# Patient Record
Sex: Female | Born: 1950
Health system: Southern US, Community
[De-identification: ages and names within clinical notes are randomized; demographics above are authoritative.]

## PROBLEM LIST (undated history)

## (undated) DIAGNOSIS — Z87442 Personal history of urinary calculi: Secondary | ICD-10-CM

## (undated) DIAGNOSIS — F419 Anxiety disorder, unspecified: Secondary | ICD-10-CM

## (undated) DIAGNOSIS — T7840XA Allergy, unspecified, initial encounter: Secondary | ICD-10-CM

## (undated) DIAGNOSIS — Z6827 Body mass index (BMI) 27.0-27.9, adult: Secondary | ICD-10-CM

## (undated) DIAGNOSIS — E559 Vitamin D deficiency, unspecified: Secondary | ICD-10-CM

## (undated) DIAGNOSIS — E785 Hyperlipidemia, unspecified: Secondary | ICD-10-CM

## (undated) DIAGNOSIS — J302 Other seasonal allergic rhinitis: Secondary | ICD-10-CM

## (undated) DIAGNOSIS — K219 Gastro-esophageal reflux disease without esophagitis: Secondary | ICD-10-CM

## (undated) DIAGNOSIS — F410 Panic disorder [episodic paroxysmal anxiety] without agoraphobia: Secondary | ICD-10-CM

## (undated) HISTORY — DX: Panic disorder (episodic paroxysmal anxiety): F41.0

## (undated) HISTORY — DX: Gastro-esophageal reflux disease without esophagitis: K21.9

## (undated) HISTORY — DX: Body mass index (BMI) 27.0-27.9, adult: Z68.27

## (undated) HISTORY — DX: Personal history of urinary calculi: Z87.442

## (undated) HISTORY — DX: Hyperlipidemia, unspecified: E78.5

## (undated) HISTORY — DX: Other seasonal allergic rhinitis: J30.2

## (undated) HISTORY — DX: Allergy, unspecified, initial encounter: T78.40XA

## (undated) HISTORY — DX: Vitamin D deficiency, unspecified: E55.9

## (undated) HISTORY — DX: Anxiety disorder, unspecified: F41.9

## (undated) HISTORY — PX: ABDOMINAL HYSTERECTOMY: SHX81

---

## 1998-07-08 ENCOUNTER — Other Ambulatory Visit: Admission: RE | Admit: 1998-07-08 | Discharge: 1998-07-08 | Payer: Self-pay | Admitting: Gynecology

## 1999-08-04 ENCOUNTER — Encounter: Payer: Self-pay | Admitting: Gynecology

## 1999-08-04 ENCOUNTER — Encounter: Admission: RE | Admit: 1999-08-04 | Discharge: 1999-08-04 | Payer: Self-pay | Admitting: Gynecology

## 1999-08-31 ENCOUNTER — Other Ambulatory Visit: Admission: RE | Admit: 1999-08-31 | Discharge: 1999-08-31 | Payer: Self-pay | Admitting: Gynecology

## 2000-08-09 ENCOUNTER — Encounter: Admission: RE | Admit: 2000-08-09 | Discharge: 2000-08-09 | Payer: Self-pay | Admitting: Gynecology

## 2000-08-09 ENCOUNTER — Encounter: Payer: Self-pay | Admitting: Gynecology

## 2000-11-07 ENCOUNTER — Other Ambulatory Visit: Admission: RE | Admit: 2000-11-07 | Discharge: 2000-11-07 | Payer: Self-pay | Admitting: Gynecology

## 2001-10-06 ENCOUNTER — Encounter: Admission: RE | Admit: 2001-10-06 | Discharge: 2001-10-06 | Payer: Self-pay | Admitting: Gynecology

## 2001-10-06 ENCOUNTER — Encounter: Payer: Self-pay | Admitting: Gynecology

## 2002-02-01 ENCOUNTER — Other Ambulatory Visit: Admission: RE | Admit: 2002-02-01 | Discharge: 2002-02-01 | Payer: Self-pay | Admitting: Gynecology

## 2003-01-31 ENCOUNTER — Encounter: Payer: Self-pay | Admitting: Gynecology

## 2003-01-31 ENCOUNTER — Encounter: Admission: RE | Admit: 2003-01-31 | Discharge: 2003-01-31 | Payer: Self-pay | Admitting: Gynecology

## 2003-02-26 ENCOUNTER — Other Ambulatory Visit: Admission: RE | Admit: 2003-02-26 | Discharge: 2003-02-26 | Payer: Self-pay | Admitting: Gynecology

## 2004-04-10 ENCOUNTER — Encounter: Admission: RE | Admit: 2004-04-10 | Discharge: 2004-04-10 | Payer: Self-pay | Admitting: Gynecology

## 2004-05-05 ENCOUNTER — Other Ambulatory Visit: Admission: RE | Admit: 2004-05-05 | Discharge: 2004-05-05 | Payer: Self-pay | Admitting: Gynecology

## 2004-05-31 ENCOUNTER — Emergency Department (HOSPITAL_COMMUNITY): Admission: EM | Admit: 2004-05-31 | Discharge: 2004-05-31 | Payer: Self-pay | Admitting: Emergency Medicine

## 2005-08-16 ENCOUNTER — Encounter: Admission: RE | Admit: 2005-08-16 | Discharge: 2005-08-16 | Payer: Self-pay | Admitting: Gynecology

## 2005-09-06 ENCOUNTER — Encounter: Admission: RE | Admit: 2005-09-06 | Discharge: 2005-09-06 | Payer: Self-pay | Admitting: Gynecology

## 2005-10-04 ENCOUNTER — Other Ambulatory Visit: Admission: RE | Admit: 2005-10-04 | Discharge: 2005-10-04 | Payer: Self-pay | Admitting: Gynecology

## 2006-09-28 ENCOUNTER — Encounter: Admission: RE | Admit: 2006-09-28 | Discharge: 2006-09-28 | Payer: Self-pay | Admitting: Gynecology

## 2006-10-25 ENCOUNTER — Other Ambulatory Visit: Admission: RE | Admit: 2006-10-25 | Discharge: 2006-10-25 | Payer: Self-pay | Admitting: Gynecology

## 2007-10-12 ENCOUNTER — Encounter: Admission: RE | Admit: 2007-10-12 | Discharge: 2007-10-12 | Payer: Self-pay | Admitting: Gynecology

## 2009-12-01 ENCOUNTER — Encounter: Admission: RE | Admit: 2009-12-01 | Discharge: 2009-12-01 | Payer: Self-pay | Admitting: Gynecology

## 2011-06-09 ENCOUNTER — Other Ambulatory Visit: Payer: Self-pay | Admitting: Gynecology

## 2011-06-09 DIAGNOSIS — Z1231 Encounter for screening mammogram for malignant neoplasm of breast: Secondary | ICD-10-CM

## 2011-06-10 ENCOUNTER — Ambulatory Visit
Admission: RE | Admit: 2011-06-10 | Discharge: 2011-06-10 | Disposition: A | Discharge status: Home / Self Care | Payer: BC Managed Care – PPO | Source: Ambulatory Visit | Attending: Gynecology | Admitting: Gynecology

## 2011-06-10 DIAGNOSIS — Z1231 Encounter for screening mammogram for malignant neoplasm of breast: Secondary | ICD-10-CM

## 2012-05-24 ENCOUNTER — Telehealth: Payer: Self-pay | Admitting: Gastroenterology

## 2012-05-24 NOTE — Telephone Encounter (Signed)
Pt states she has been having problems with acid reflux for a while. States she had a "spell" a couple of nights ago. Reports she had a lot of pain right at her breast line between her shoulder and around to her back. She was seen by her primary in [place] Dr. Foye Deer and sent to [facility] for an Ultrasound of the gallbladder. Pt states it was negative for stones but they wanted her to have a HIDA scan done next. Pt states she does not want to be evaluated at [facility]. Would like to be seen by [facility], requesting an appt.sooner than 1st available. Pt scheduled to see Mike Gip PA tomorrow at 9am. Pt to bring hospital records and notes from her PCP to visit. Pt instructed to bring medications, insurance card and copay with her. Pt aware of appt date and time.

## 2012-05-25 ENCOUNTER — Ambulatory Visit (INDEPENDENT_AMBULATORY_CARE_PROVIDER_SITE_OTHER): Payer: BC Managed Care – PPO | Admitting: Physician Assistant

## 2012-05-25 ENCOUNTER — Encounter: Payer: Self-pay | Admitting: Physician Assistant

## 2012-05-25 VITALS — BP 128/64 | HR 74 | Ht 62.0 in | Wt 165.0 lb

## 2012-05-25 DIAGNOSIS — R1013 Epigastric pain: Secondary | ICD-10-CM

## 2012-05-25 DIAGNOSIS — K219 Gastro-esophageal reflux disease without esophagitis: Secondary | ICD-10-CM

## 2012-05-25 MED ORDER — PANTOPRAZOLE SODIUM 40 MG PO TBEC: 40.0000 mg | DELAYED_RELEASE_TABLET | Freq: Every day | ORAL | Status: DC

## 2012-05-25 NOTE — Progress Notes (Signed)
Subjective:    Patient ID: Jasmin Owens, female    DOB: [birth year], 61 y.o.   MRN: [id number]  HPI Jasmin Owens is a pleasant 61 year old female known to Dr. Arlyce Dice remotely from upper endoscopy done in 2004. This was a normal exam. She also relates that she had colonoscopy by Dr. Kinnie Scales about 5 years ago and we will obtain copy of that report.  She is in generally good health. She comes in today with complaints of an episode of fairly intense epigastric pain with radiation into her shoulder blades which occurred in the middle tonight. This episode occurred week ago he has not had another episode sense. She says the pain lasted for about 10-15 minutes was associated with nausea but no vomiting and then gradually eased off. She had it up out of bed walker around considering her recliner etc. She was seen by her primary care physician and had labs done on 05/24/2012 these are unremarkable including a normal CBC and seen at with the exception of an alkaline phosphatase mildly elevated at 121. She also had a limited upper abdominal ultrasound done at [facility] which reads no evidence of gallstones or gallbladder wall thickening common bile duct normal size there is no mention of the pancreas. Patient says she has been having problems with reflux symptoms which are present gradually progressed over the past several months. She says she will typically gets burning and indigestion after meals had been taking over-the-counter Prilosec and ranitidine as needed and got to the point where those were not working very well. She had been started on Protonix about 4 weeks ago by her primary care physician and has been taking a daily and says that she does feel better from that standpoint. She denies any dysphagia or odynophagia. Her appetite has been fine and her weight has been stable. She says the episode that she had in the middle of the night no different than her usual indigestion type symptoms    Review  of Systems  Constitutional: Negative.   HENT: Negative.   Eyes: Negative.   Respiratory: Negative.   Cardiovascular: Negative.   Gastrointestinal: Positive for nausea and abdominal pain.  Genitourinary: Negative.   Musculoskeletal: Negative.   Neurological: Negative.   Hematological: Negative.   Psychiatric/Behavioral: Negative.    Outpatient Encounter Prescriptions as of 05/25/2012  Medication Sig Dispense Refill  . aspirin 81 MG tablet Take 81 mg by mouth daily.      . pantoprazole (PROTONIX) 40 MG tablet Take 1 tablet (40 mg total) by mouth daily. Takes one tablet by mouth once daily  30 tablet  11  . DISCONTD: pantoprazole (PROTONIX) 40 MG tablet Takes one tablet by mouth once daily          No Known Allergies Patient Active Problem List  Diagnosis  . GERD (gastroesophageal reflux disease)   History   Social History  . Marital Status: Married    Spouse Name: N/A    Number of Children: N/A  . Years of Education: N/A   Occupational History  . Not on file.   Social History Main Topics  . Smoking status: Never Smoker   . Smokeless tobacco: Never Used  . Alcohol Use: No  . Drug Use: No  . Sexually Active: Not on file   Other Topics Concern  . Not on file   Social History Narrative  . No narrative on file    Objective:   Physical Exam well-developed white female in no acute distress, pleasant  blood pressure 128/64 pulse 74 height 5 foot 2 weight 165. HEENT; nontraumatic normocephalic EOMI PERRLA sclera anicteric,Neck; Supple no JVD, Cardiovascular; regular rate and rhythm with S1-S2 no murmur or gallop, Pulmonary; clear bilaterally, Abdomen; soft basically nontender there's no palpable mass or hepatosplenomegaly bowel sounds are active, Rectal; not done, Extremities; no clubbing cyanosis or edema skin warm and dry, Psych; mood and affect normal and appropriate.        Assessment & Plan:  #69 61 year old female with chronic GERD-currently doing well on daily  protonix #2 single episode of intense epigastric pain radiating to back to her back and into the shoulder blades, occurring 1 week ago. Patient's description of pain is a fairly typical for biliary colic could have also been secondary to acid reflux. Will rule out biliary dyskinesia #3 colon neoplasia surveillance-pt had colonoscopy approximately 5 years ago per Dr. Kinnie Scales we will obtain copy of the report, then decide on timing of followup.  Plan; continue Protonix 40 mg by mouth every morning-prescriptions sent today Low-fat diet Schedule for [facility] HIDA scan-further plans pending results of HIDA scan Patient was also asked to call should she have any recurrent episodes and at that time would obtain a CBC and hepatic panel.

## 2012-05-25 NOTE — Patient Instructions (Addendum)
We have scheduled the [place] for Mon 05-29-2012.  Arrive to [facility], 1st floor at 9:45 Am.   Do not take the Protonix that day.  Have nothing by mouth after midnight.  We sent a prescription refill for Protonix to [facility].

## 2012-05-25 NOTE — Progress Notes (Signed)
I agree with assessment and plan.

## 2012-05-29 ENCOUNTER — Other Ambulatory Visit (HOSPITAL_COMMUNITY): Payer: BC Managed Care – PPO

## 2012-05-29 ENCOUNTER — Encounter (HOSPITAL_COMMUNITY)
Admission: RE | Admit: 2012-05-29 | Discharge: 2012-05-29 | Disposition: A | Discharge status: Home / Self Care | Payer: BC Managed Care – PPO | Source: Ambulatory Visit | Attending: Physician Assistant | Admitting: Physician Assistant

## 2012-05-29 DIAGNOSIS — R112 Nausea with vomiting, unspecified: Secondary | ICD-10-CM | POA: Insufficient documentation

## 2012-05-29 DIAGNOSIS — R109 Unspecified abdominal pain: Secondary | ICD-10-CM | POA: Insufficient documentation

## 2012-05-29 DIAGNOSIS — R1013 Epigastric pain: Secondary | ICD-10-CM

## 2012-05-29 MED ORDER — TECHNETIUM TC 99M MEBROFENIN IV KIT
5.5000 | PACK | Freq: Once | INTRAVENOUS | Status: AC | PRN
  Administered 2012-05-29: 6 via INTRAVENOUS

## 2012-06-20 ENCOUNTER — Telehealth: Payer: Self-pay | Admitting: Physician Assistant

## 2012-06-20 NOTE — Telephone Encounter (Signed)
Forward 11 pages from [facility] to Dr. Mike Gip for review on 06-20-12 ym

## 2012-07-04 ENCOUNTER — Other Ambulatory Visit: Payer: Self-pay | Admitting: Gynecology

## 2012-07-04 DIAGNOSIS — Z1231 Encounter for screening mammogram for malignant neoplasm of breast: Secondary | ICD-10-CM

## 2012-08-03 ENCOUNTER — Ambulatory Visit
Admission: RE | Admit: 2012-08-03 | Discharge: 2012-08-03 | Disposition: A | Discharge status: Home / Self Care | Payer: BC Managed Care – PPO | Source: Ambulatory Visit | Attending: Gynecology | Admitting: Gynecology

## 2012-08-03 DIAGNOSIS — Z1231 Encounter for screening mammogram for malignant neoplasm of breast: Secondary | ICD-10-CM

## 2013-05-03 IMAGING — NM NM HEPATO W/GB/PHARM/[PERSON_NAME]
1 series · 12 of 12 positions shown · non-contrast
Comparison: None.

CLINICAL DATA: Right upper quadrant and epigastric pain radiating
to back. Nausea and vomiting.

NUCLEAR MEDICINE HEPATOBILIARY IMAGING WITH GALLBLADDER EF
TECHNIQUE: Sequential images of the abdomen were obtained [DATE] minutes following intravenous administration of
radiopharmaceutical. After oral ingestion of Ensure, gallbladder
ejection fraction was determined.
Radiopharmaceutical:  5.5 mCi Ac-PPm Choletec

[Series 1: hepato · 4.46mm/px · 2 acquisitions, 12 frames shown]
[im 1/2]
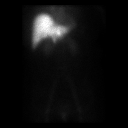
[im 1/2]
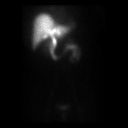
[im 1/2]
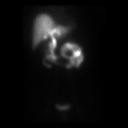
[im 1/2]
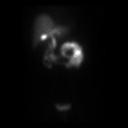
[im 1/2]
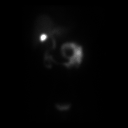
[im 1/2]
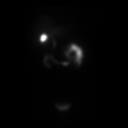
[im 2/2]
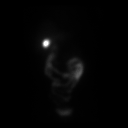
[im 2/2]
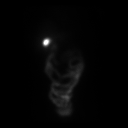
[im 2/2]
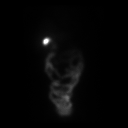
[im 2/2]
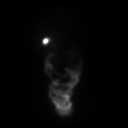
[im 2/2]
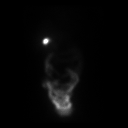
[im 2/2]
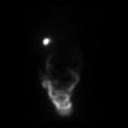

[12 of 12 positions shown; findings below may reference images not displayed]

FINDINGS: Prompt radiopharmaceutical uptake by the liver is seen.
Liver is normal in appearance.

Prompt biliary excretion activity is demonstrated, which reaches
the small bowel initially on the 15-minute image.  Gallbladder
activity is seen initially on the 10-minute image and shows
progressive filling subsequently.

Gallbladder ejection fraction:  55%. Normal gallbladder ejection
fraction with Ensure is greater than 33%.

The patient did not experience symptoms after oral ingestion of
Ensure.
IMPRESSION: 1.  Normal hepatobiliary scan.
2.  Normal gallbladder ejection fraction of 55%.

## 2013-12-06 ENCOUNTER — Other Ambulatory Visit: Payer: Self-pay

## 2013-12-06 DIAGNOSIS — Z1231 Encounter for screening mammogram for malignant neoplasm of breast: Secondary | ICD-10-CM

## 2013-12-24 ENCOUNTER — Ambulatory Visit: Payer: BC Managed Care – PPO

## 2015-04-11 ENCOUNTER — Telehealth: Payer: Self-pay | Admitting: Gastroenterology

## 2015-04-11 NOTE — Telephone Encounter (Signed)
Received records from [place] forwarded to Dr. Deatra Ina 04/11/15 fbg.

## 2015-05-12 ENCOUNTER — Ambulatory Visit: Payer: Self-pay | Admitting: Gastroenterology

## 2016-10-23 DIAGNOSIS — R339 Retention of urine, unspecified: Secondary | ICD-10-CM | POA: Diagnosis not present

## 2016-11-25 DIAGNOSIS — Z6828 Body mass index (BMI) 28.0-28.9, adult: Secondary | ICD-10-CM | POA: Diagnosis not present

## 2016-11-25 DIAGNOSIS — E663 Overweight: Secondary | ICD-10-CM | POA: Diagnosis not present

## 2016-11-25 DIAGNOSIS — R109 Unspecified abdominal pain: Secondary | ICD-10-CM | POA: Diagnosis not present

## 2016-11-25 DIAGNOSIS — Z9181 History of falling: Secondary | ICD-10-CM | POA: Diagnosis not present

## 2016-11-25 DIAGNOSIS — M545 Low back pain: Secondary | ICD-10-CM | POA: Diagnosis not present

## 2016-12-27 DIAGNOSIS — H53022 Refractive amblyopia, left eye: Secondary | ICD-10-CM | POA: Diagnosis not present

## 2016-12-27 DIAGNOSIS — H52221 Regular astigmatism, right eye: Secondary | ICD-10-CM | POA: Diagnosis not present

## 2016-12-27 DIAGNOSIS — H2513 Age-related nuclear cataract, bilateral: Secondary | ICD-10-CM | POA: Diagnosis not present

## 2017-02-18 DIAGNOSIS — J029 Acute pharyngitis, unspecified: Secondary | ICD-10-CM | POA: Diagnosis not present

## 2017-02-18 DIAGNOSIS — J208 Acute bronchitis due to other specified organisms: Secondary | ICD-10-CM | POA: Diagnosis not present

## 2017-02-18 DIAGNOSIS — Z6828 Body mass index (BMI) 28.0-28.9, adult: Secondary | ICD-10-CM | POA: Diagnosis not present

## 2017-03-22 DIAGNOSIS — Z6828 Body mass index (BMI) 28.0-28.9, adult: Secondary | ICD-10-CM | POA: Diagnosis not present

## 2017-03-22 DIAGNOSIS — F410 Panic disorder [episodic paroxysmal anxiety] without agoraphobia: Secondary | ICD-10-CM | POA: Diagnosis not present

## 2017-03-22 DIAGNOSIS — F419 Anxiety disorder, unspecified: Secondary | ICD-10-CM | POA: Diagnosis not present

## 2017-04-22 DIAGNOSIS — Z1389 Encounter for screening for other disorder: Secondary | ICD-10-CM | POA: Diagnosis not present

## 2017-04-22 DIAGNOSIS — Z6829 Body mass index (BMI) 29.0-29.9, adult: Secondary | ICD-10-CM | POA: Diagnosis not present

## 2017-04-22 DIAGNOSIS — F410 Panic disorder [episodic paroxysmal anxiety] without agoraphobia: Secondary | ICD-10-CM | POA: Diagnosis not present

## 2017-04-22 DIAGNOSIS — Z23 Encounter for immunization: Secondary | ICD-10-CM | POA: Diagnosis not present

## 2017-04-22 DIAGNOSIS — F419 Anxiety disorder, unspecified: Secondary | ICD-10-CM | POA: Diagnosis not present

## 2017-07-26 DIAGNOSIS — Z23 Encounter for immunization: Secondary | ICD-10-CM | POA: Diagnosis not present

## 2017-07-26 DIAGNOSIS — Z6829 Body mass index (BMI) 29.0-29.9, adult: Secondary | ICD-10-CM | POA: Diagnosis not present

## 2017-07-26 DIAGNOSIS — F419 Anxiety disorder, unspecified: Secondary | ICD-10-CM | POA: Diagnosis not present

## 2017-07-26 DIAGNOSIS — F410 Panic disorder [episodic paroxysmal anxiety] without agoraphobia: Secondary | ICD-10-CM | POA: Diagnosis not present

## 2017-07-26 DIAGNOSIS — E785 Hyperlipidemia, unspecified: Secondary | ICD-10-CM | POA: Diagnosis not present

## 2017-07-26 DIAGNOSIS — E559 Vitamin D deficiency, unspecified: Secondary | ICD-10-CM | POA: Diagnosis not present

## 2017-08-18 DIAGNOSIS — K5730 Diverticulosis of large intestine without perforation or abscess without bleeding: Secondary | ICD-10-CM | POA: Diagnosis not present

## 2017-08-18 DIAGNOSIS — D125 Benign neoplasm of sigmoid colon: Secondary | ICD-10-CM | POA: Diagnosis not present

## 2017-08-18 DIAGNOSIS — Z1211 Encounter for screening for malignant neoplasm of colon: Secondary | ICD-10-CM | POA: Diagnosis not present

## 2017-08-18 DIAGNOSIS — K635 Polyp of colon: Secondary | ICD-10-CM | POA: Diagnosis not present

## 2017-09-22 DIAGNOSIS — N952 Postmenopausal atrophic vaginitis: Secondary | ICD-10-CM | POA: Diagnosis not present

## 2017-09-22 DIAGNOSIS — Z01419 Encounter for gynecological examination (general) (routine) without abnormal findings: Secondary | ICD-10-CM | POA: Diagnosis not present

## 2017-09-22 DIAGNOSIS — Z1231 Encounter for screening mammogram for malignant neoplasm of breast: Secondary | ICD-10-CM | POA: Diagnosis not present

## 2017-10-11 DIAGNOSIS — H53022 Refractive amblyopia, left eye: Secondary | ICD-10-CM | POA: Diagnosis not present

## 2017-10-11 DIAGNOSIS — H2513 Age-related nuclear cataract, bilateral: Secondary | ICD-10-CM | POA: Diagnosis not present

## 2017-12-07 DIAGNOSIS — J019 Acute sinusitis, unspecified: Secondary | ICD-10-CM | POA: Diagnosis not present

## 2018-01-26 DIAGNOSIS — Z6830 Body mass index (BMI) 30.0-30.9, adult: Secondary | ICD-10-CM | POA: Diagnosis not present

## 2018-01-26 DIAGNOSIS — Z9181 History of falling: Secondary | ICD-10-CM | POA: Diagnosis not present

## 2018-01-26 DIAGNOSIS — F410 Panic disorder [episodic paroxysmal anxiety] without agoraphobia: Secondary | ICD-10-CM | POA: Diagnosis not present

## 2018-01-26 DIAGNOSIS — Z139 Encounter for screening, unspecified: Secondary | ICD-10-CM | POA: Diagnosis not present

## 2018-01-26 DIAGNOSIS — E785 Hyperlipidemia, unspecified: Secondary | ICD-10-CM | POA: Diagnosis not present

## 2018-01-26 DIAGNOSIS — E559 Vitamin D deficiency, unspecified: Secondary | ICD-10-CM | POA: Diagnosis not present

## 2018-01-26 DIAGNOSIS — F419 Anxiety disorder, unspecified: Secondary | ICD-10-CM | POA: Diagnosis not present

## 2018-06-28 DIAGNOSIS — Z6830 Body mass index (BMI) 30.0-30.9, adult: Secondary | ICD-10-CM | POA: Diagnosis not present

## 2018-06-28 DIAGNOSIS — E785 Hyperlipidemia, unspecified: Secondary | ICD-10-CM | POA: Diagnosis not present

## 2018-06-28 DIAGNOSIS — Z0000 Encounter for general adult medical examination without abnormal findings: Secondary | ICD-10-CM | POA: Diagnosis not present

## 2018-06-28 DIAGNOSIS — Z1339 Encounter for screening examination for other mental health and behavioral disorders: Secondary | ICD-10-CM | POA: Diagnosis not present

## 2018-06-28 DIAGNOSIS — Z9181 History of falling: Secondary | ICD-10-CM | POA: Diagnosis not present

## 2018-06-28 DIAGNOSIS — Z1331 Encounter for screening for depression: Secondary | ICD-10-CM | POA: Diagnosis not present

## 2018-06-28 DIAGNOSIS — Z23 Encounter for immunization: Secondary | ICD-10-CM | POA: Diagnosis not present

## 2018-06-28 DIAGNOSIS — Z136 Encounter for screening for cardiovascular disorders: Secondary | ICD-10-CM | POA: Diagnosis not present

## 2018-08-02 DIAGNOSIS — E789 Disorder of lipoprotein metabolism, unspecified: Secondary | ICD-10-CM | POA: Diagnosis not present

## 2018-08-02 DIAGNOSIS — E785 Hyperlipidemia, unspecified: Secondary | ICD-10-CM | POA: Diagnosis not present

## 2018-08-02 DIAGNOSIS — J324 Chronic pansinusitis: Secondary | ICD-10-CM | POA: Diagnosis not present

## 2018-08-02 DIAGNOSIS — F419 Anxiety disorder, unspecified: Secondary | ICD-10-CM | POA: Diagnosis not present

## 2018-08-02 DIAGNOSIS — E559 Vitamin D deficiency, unspecified: Secondary | ICD-10-CM | POA: Diagnosis not present

## 2018-08-02 DIAGNOSIS — Z23 Encounter for immunization: Secondary | ICD-10-CM | POA: Diagnosis not present

## 2018-08-02 DIAGNOSIS — F410 Panic disorder [episodic paroxysmal anxiety] without agoraphobia: Secondary | ICD-10-CM | POA: Diagnosis not present

## 2018-08-10 ENCOUNTER — Emergency Department (HOSPITAL_COMMUNITY)
Admission: EM | Admit: 2018-08-10 | Discharge: 2018-08-10 | Disposition: A | Discharge status: Home / Self Care | Payer: PPO | Attending: Emergency Medicine | Admitting: Emergency Medicine

## 2018-08-10 ENCOUNTER — Other Ambulatory Visit: Payer: Self-pay

## 2018-08-10 ENCOUNTER — Encounter (HOSPITAL_COMMUNITY): Payer: Self-pay

## 2018-08-10 DIAGNOSIS — R739 Hyperglycemia, unspecified: Secondary | ICD-10-CM | POA: Diagnosis not present

## 2018-08-10 DIAGNOSIS — R112 Nausea with vomiting, unspecified: Secondary | ICD-10-CM | POA: Diagnosis not present

## 2018-08-10 DIAGNOSIS — M6281 Muscle weakness (generalized): Secondary | ICD-10-CM | POA: Diagnosis not present

## 2018-08-10 DIAGNOSIS — Z7982 Long term (current) use of aspirin: Secondary | ICD-10-CM | POA: Diagnosis not present

## 2018-08-10 DIAGNOSIS — R531 Weakness: Secondary | ICD-10-CM | POA: Diagnosis not present

## 2018-08-10 DIAGNOSIS — R61 Generalized hyperhidrosis: Secondary | ICD-10-CM | POA: Diagnosis not present

## 2018-08-10 DIAGNOSIS — R0602 Shortness of breath: Secondary | ICD-10-CM | POA: Diagnosis not present

## 2018-08-10 DIAGNOSIS — I10 Essential (primary) hypertension: Secondary | ICD-10-CM | POA: Diagnosis not present

## 2018-08-10 DIAGNOSIS — R1110 Vomiting, unspecified: Secondary | ICD-10-CM | POA: Diagnosis present

## 2018-08-10 LAB — CBC WITH DIFFERENTIAL/PLATELET
ABS IMMATURE GRANULOCYTES: 0.1 10*3/uL — AB (ref 0.00–0.07)
BASOS PCT: 0 %
Basophils Absolute: 0 10*3/uL (ref 0.0–0.1)
Eosinophils Absolute: 0.1 10*3/uL (ref 0.0–0.5)
Eosinophils Relative: 1 %
HCT: 43.8 % (ref 36.0–46.0)
HEMOGLOBIN: 13.7 g/dL (ref 12.0–15.0)
IMMATURE GRANULOCYTES: 1 %
Lymphocytes Relative: 17 %
Lymphs Abs: 1.6 10*3/uL (ref 0.7–4.0)
MCH: 28 pg (ref 26.0–34.0)
MCHC: 31.3 g/dL (ref 30.0–36.0)
MCV: 89.6 fL (ref 80.0–100.0)
MONOS PCT: 4 %
Monocytes Absolute: 0.4 10*3/uL (ref 0.1–1.0)
NEUTROS PCT: 77 %
NRBC: 0 % (ref 0.0–0.2)
Neutro Abs: 7.3 10*3/uL (ref 1.7–7.7)
PLATELETS: 323 10*3/uL (ref 150–400)
RBC: 4.89 MIL/uL (ref 3.87–5.11)
RDW: 12.4 % (ref 11.5–15.5)
WBC: 9.5 10*3/uL (ref 4.0–10.5)

## 2018-08-10 LAB — COMPREHENSIVE METABOLIC PANEL
ALT: 17 U/L (ref 0–44)
AST: 19 U/L (ref 15–41)
Albumin: 3.9 g/dL (ref 3.5–5.0)
Alkaline Phosphatase: 90 U/L (ref 38–126)
Anion gap: 8 (ref 5–15)
BUN: 9 mg/dL (ref 8–23)
CALCIUM: 8.8 mg/dL — AB (ref 8.9–10.3)
CO2: 25 mmol/L (ref 22–32)
Chloride: 107 mmol/L (ref 98–111)
Creatinine, Ser: 0.75 mg/dL (ref 0.44–1.00)
GFR calc Af Amer: >60 mL/min (ref >60)
Glucose, Bld: 145 mg/dL — ABNORMAL HIGH (ref 70–99)
Potassium: 3.5 mmol/L (ref 3.5–5.1)
SODIUM: 140 mmol/L (ref 135–145)
Total Bilirubin: 0.7 mg/dL (ref 0.3–1.2)
Total Protein: 7.3 g/dL (ref 6.5–8.1)

## 2018-08-10 LAB — I-STAT TROPONIN, ED: Troponin i, poc: 0 ng/mL (ref 0.00–0.08)

## 2018-08-10 LAB — LIPASE, BLOOD: LIPASE: 27 U/L (ref 11–51)

## 2018-08-10 MED ORDER — METOCLOPRAMIDE HCL 5 MG/ML IJ SOLN
5.0000 mg | Freq: Once | INTRAMUSCULAR | Status: AC
  Administered 2018-08-10: 5 mg via INTRAVENOUS
  Filled 2018-08-10: qty 2

## 2018-08-10 MED ORDER — METOCLOPRAMIDE HCL 10 MG PO TABS: 5.0000 mg | ORAL_TABLET | Freq: Four times a day (QID) | ORAL | 0 refills | Status: DC | PRN

## 2018-08-10 NOTE — ED Triage Notes (Signed)
Pt presents bib Jasmin Owens with sudden onset emesis, weakness, palor, and diaphoresis. BP on arrival 170/110.  Transporters no access to EKG monitor. Denies pain. States she was normal this morning sitting at desk at work and started feeling dizzy.  Took her antivert medication for inner ear issue and began vomiting shortly after.

## 2018-08-10 NOTE — ED Provider Notes (Signed)
[facility] EMERGENCY DEPARTMENT Provider Note   CSN: [id number] Arrival date & time: 08/10/18  1122     History   Chief Complaint Chief Complaint  Patient presents with  . Emesis    HPI Jasmin Owens is a 67 y.o. female.  HPI Complains of generalized weakness, nausea and vomiting onset 8:30 AM today while at work.  She also feels "clammy" meaning intermittently hot and cold.  She denies any shortness of breath denies chest pain or pain anywhere.  Associated symptoms: she vomited twice, no hematemesis.  She feels improved without treatment since arrival here.  Still complains of mild nausea.  No other associated symptoms. Past Medical History:  Diagnosis Date  . GERD (gastroesophageal reflux disease)   . History of kidney stones   Vertigo, hypercholesterolemia  Patient Active Problem List   Diagnosis Date Noted  . GERD (gastroesophageal reflux disease) 05/25/2012    Past Surgical History:  Procedure Laterality Date  . ABDOMINAL HYSTERECTOMY       OB History   None      Home Medications    Prior to Admission medications   Medication Sig Start Date End Date Taking? Authorizing Provider  aspirin 81 MG tablet Take 81 mg by mouth daily.    [provider]  pantoprazole (PROTONIX) 40 MG tablet Take 1 tablet (40 mg total) by mouth daily. Takes one tablet by mouth once daily 05/25/12   Esterwood, Amy S, PA-C    Family History Family History  Problem Relation Age of Onset  . Colon cancer Neg Hx     Social History Social History   Tobacco Use  . Smoking status: Never Smoker  . Smokeless tobacco: Never Used  Substance Use Topics  . Alcohol use: No  . Drug use: No     Allergies   Patient has no known allergies.   Review of Systems Review of Systems  Constitutional: Positive for diaphoresis.  HENT: Negative.   Respiratory: Negative.   Cardiovascular: Negative.   Gastrointestinal: Positive for nausea and vomiting.    Musculoskeletal: Negative.   Skin: Negative.   Neurological: Negative.   Psychiatric/Behavioral: Negative.   All other systems reviewed and are negative.    Physical Exam Updated Vital Signs BP (!) 146/88 (BP Location: Right Arm)   Pulse 93   Temp (!) 97.5 F (36.4 C) (Oral)   Resp 14   Ht 5\' 2"  (1.575 m)   Wt 74.8 kg   SpO2 100%   BMI 30.18 kg/m   Physical Exam  Constitutional: She appears well-developed and well-nourished.  HENT:  Head: Normocephalic and atraumatic.  Eyes: Pupils are equal, round, and reactive to light. Conjunctivae are normal.  Neck: Neck supple. No tracheal deviation present. No thyromegaly present.  Cardiovascular: Normal rate and regular rhythm.  No murmur heard. Pulmonary/Chest: Effort normal and breath sounds normal.  Abdominal: Soft. Bowel sounds are normal. She exhibits no distension. There is no tenderness.  Musculoskeletal: Normal range of motion. She exhibits no edema or tenderness.  Neurological: She is alert. Coordination normal.  Skin: Skin is warm and dry. No rash noted.  Psychiatric: She has a normal mood and affect.  Nursing note and vitals reviewed.    ED Treatments / Results  Labs (all labs ordered are listed, but only abnormal results are displayed) Labs Reviewed  COMPREHENSIVE METABOLIC PANEL  CBC WITH DIFFERENTIAL/PLATELET  LIPASE, BLOOD  I-STAT TROPONIN, ED    EKG EKG Interpretation  Date/Time:  Thursday August 10 2018 11:28:51 EST Ventricular Rate:  90 PR Interval:    QRS Duration: 94 QT Interval:  373 QTC Calculation: 457 R Axis:   -5 Text Interpretation:  Sinus rhythm Abnormal R-wave progression, early transition Left ventricular hypertrophy No old tracing to compare Confirmed by Higgston, Inocente Salles [phone]) on 08/10/2018 11:35:43 AM   Radiology No results found.  Procedures Procedures (including critical care time)  Medications Ordered in ED Medications  metoCLOPramide (REGLAN) injection 5 mg (has no  administration in time range)   1:20 PM patient feels mildly generally weak.  She is no longer nauseated after treatment with intravenous Reglan.  She denies pain anywhere.  She feels ready to go home.  Lab work remarkable for glucose 145 otherwise normal insistent with mild hyperglycemia.  Initial Impression / Assessment and Plan / ED Course  I have reviewed the triage vital signs and the nursing notes.  Pertinent labs & imaging results that were available during my care of the patient were reviewed by me and considered in my medical decision making (see chart for details).    Strongly doubt cardiac etiology of symptoms.  Heart score equals 3 Follow-up PMD.  Prescription Reglan Final Clinical Impressions(s) / ED Diagnoses  Diagnoses #1 generalized weakness #2 nausea and vomiting #3 hyperglycemia Final diagnoses:  None    ED Discharge Orders    None       Orlie Dakin, MD 08/10/18 1326

## 2018-08-10 NOTE — Discharge Instructions (Addendum)
Your blood sugar was mildly elevated today at 145.  Take the medication prescribed as needed for nausea.Make sure that you drink at least six 8 ounce glasses of water each day in order to stay well-hydrated.  Call Dr. Delena Bali tomorrow to arrange to get seen in his office within the next week.  Tell office staff about your visit here in about your mildly elevated blood sugar.  Return if your condition worsens for any reason

## 2018-08-14 DIAGNOSIS — R112 Nausea with vomiting, unspecified: Secondary | ICD-10-CM | POA: Diagnosis not present

## 2018-08-14 DIAGNOSIS — F410 Panic disorder [episodic paroxysmal anxiety] without agoraphobia: Secondary | ICD-10-CM | POA: Diagnosis not present

## 2018-08-14 DIAGNOSIS — R531 Weakness: Secondary | ICD-10-CM | POA: Diagnosis not present

## 2019-01-01 DIAGNOSIS — Z87891 Personal history of nicotine dependence: Secondary | ICD-10-CM | POA: Diagnosis not present

## 2019-01-01 DIAGNOSIS — R109 Unspecified abdominal pain: Secondary | ICD-10-CM | POA: Diagnosis not present

## 2019-01-02 DIAGNOSIS — R109 Unspecified abdominal pain: Secondary | ICD-10-CM | POA: Diagnosis not present

## 2019-02-06 DIAGNOSIS — F419 Anxiety disorder, unspecified: Secondary | ICD-10-CM | POA: Diagnosis not present

## 2019-02-06 DIAGNOSIS — Z1231 Encounter for screening mammogram for malignant neoplasm of breast: Secondary | ICD-10-CM | POA: Diagnosis not present

## 2019-02-06 DIAGNOSIS — Z139 Encounter for screening, unspecified: Secondary | ICD-10-CM | POA: Diagnosis not present

## 2019-02-06 DIAGNOSIS — E559 Vitamin D deficiency, unspecified: Secondary | ICD-10-CM | POA: Diagnosis not present

## 2019-02-06 DIAGNOSIS — F410 Panic disorder [episodic paroxysmal anxiety] without agoraphobia: Secondary | ICD-10-CM | POA: Diagnosis not present

## 2019-02-06 DIAGNOSIS — E785 Hyperlipidemia, unspecified: Secondary | ICD-10-CM | POA: Diagnosis not present

## 2019-06-19 DIAGNOSIS — L720 Epidermal cyst: Secondary | ICD-10-CM | POA: Diagnosis not present

## 2019-06-19 DIAGNOSIS — D1801 Hemangioma of skin and subcutaneous tissue: Secondary | ICD-10-CM | POA: Diagnosis not present

## 2019-06-19 DIAGNOSIS — D225 Melanocytic nevi of trunk: Secondary | ICD-10-CM | POA: Diagnosis not present

## 2019-07-03 DIAGNOSIS — Z1331 Encounter for screening for depression: Secondary | ICD-10-CM | POA: Diagnosis not present

## 2019-07-03 DIAGNOSIS — N959 Unspecified menopausal and perimenopausal disorder: Secondary | ICD-10-CM | POA: Diagnosis not present

## 2019-07-03 DIAGNOSIS — Z0000 Encounter for general adult medical examination without abnormal findings: Secondary | ICD-10-CM | POA: Diagnosis not present

## 2019-07-03 DIAGNOSIS — Z9181 History of falling: Secondary | ICD-10-CM | POA: Diagnosis not present

## 2019-07-03 DIAGNOSIS — Z1231 Encounter for screening mammogram for malignant neoplasm of breast: Secondary | ICD-10-CM | POA: Diagnosis not present

## 2019-07-03 DIAGNOSIS — E785 Hyperlipidemia, unspecified: Secondary | ICD-10-CM | POA: Diagnosis not present

## 2019-07-25 ENCOUNTER — Other Ambulatory Visit: Payer: Self-pay | Admitting: Gynecology

## 2019-07-25 DIAGNOSIS — N952 Postmenopausal atrophic vaginitis: Secondary | ICD-10-CM | POA: Diagnosis not present

## 2019-07-25 DIAGNOSIS — Z6828 Body mass index (BMI) 28.0-28.9, adult: Secondary | ICD-10-CM | POA: Diagnosis not present

## 2019-07-25 DIAGNOSIS — Z1389 Encounter for screening for other disorder: Secondary | ICD-10-CM | POA: Diagnosis not present

## 2019-07-25 DIAGNOSIS — Z01419 Encounter for gynecological examination (general) (routine) without abnormal findings: Secondary | ICD-10-CM | POA: Diagnosis not present

## 2019-07-25 DIAGNOSIS — Z13820 Encounter for screening for osteoporosis: Secondary | ICD-10-CM | POA: Diagnosis not present

## 2019-07-25 DIAGNOSIS — E2839 Other primary ovarian failure: Secondary | ICD-10-CM

## 2019-07-25 DIAGNOSIS — Z1231 Encounter for screening mammogram for malignant neoplasm of breast: Secondary | ICD-10-CM | POA: Diagnosis not present

## 2019-08-15 DIAGNOSIS — E559 Vitamin D deficiency, unspecified: Secondary | ICD-10-CM | POA: Diagnosis not present

## 2019-08-15 DIAGNOSIS — M5412 Radiculopathy, cervical region: Secondary | ICD-10-CM | POA: Diagnosis not present

## 2019-08-15 DIAGNOSIS — E785 Hyperlipidemia, unspecified: Secondary | ICD-10-CM | POA: Diagnosis not present

## 2019-08-15 DIAGNOSIS — F410 Panic disorder [episodic paroxysmal anxiety] without agoraphobia: Secondary | ICD-10-CM | POA: Diagnosis not present

## 2019-08-15 DIAGNOSIS — Z23 Encounter for immunization: Secondary | ICD-10-CM | POA: Diagnosis not present

## 2019-08-15 DIAGNOSIS — F419 Anxiety disorder, unspecified: Secondary | ICD-10-CM | POA: Diagnosis not present

## 2019-09-25 DIAGNOSIS — R519 Headache, unspecified: Secondary | ICD-10-CM | POA: Diagnosis not present

## 2019-09-27 DIAGNOSIS — R55 Syncope and collapse: Secondary | ICD-10-CM | POA: Diagnosis not present

## 2019-09-27 DIAGNOSIS — R519 Headache, unspecified: Secondary | ICD-10-CM | POA: Diagnosis not present

## 2019-10-04 ENCOUNTER — Ambulatory Visit: Payer: PPO | Admitting: Neurology

## 2019-10-17 ENCOUNTER — Other Ambulatory Visit: Payer: PPO

## 2019-11-07 ENCOUNTER — Encounter: Payer: Self-pay | Admitting: *Deleted

## 2019-11-09 ENCOUNTER — Ambulatory Visit: Payer: PPO | Admitting: Neurology

## 2019-11-14 ENCOUNTER — Other Ambulatory Visit: Payer: Self-pay

## 2019-11-14 ENCOUNTER — Encounter: Payer: Self-pay | Admitting: Neurology

## 2019-11-14 ENCOUNTER — Ambulatory Visit: Payer: PPO | Admitting: Neurology

## 2019-11-14 VITALS — BP 140/82 | HR 84 | Temp 97.7°F | Ht 62.0 in | Wt 154.0 lb

## 2019-11-14 DIAGNOSIS — M5481 Occipital neuralgia: Secondary | ICD-10-CM

## 2019-11-14 MED ORDER — GABAPENTIN 100 MG PO CAPS: 100.0000 mg | ORAL_CAPSULE | Freq: Three times a day (TID) | ORAL | 3 refills | Status: AC | PRN

## 2019-11-14 NOTE — Progress Notes (Signed)
[id number]    Provider:  Dr Jaynee Eagles Requesting Provider: Nicoletta Dress, MD Primary Care Provider:  Nicoletta Dress, MD  CC:  Occipital neuralgia  HPI:  Jasmin Owens is a 69 y.o. female here as requested by Nicoletta Dress, MD for headaches. PMHx panic attacks, HLD, kidney stones, anxiety. She had similar syndrome 10-12 years ago and work up in neurology showed no etiology, symptoms resolved until about 2 years ago. She had an episode, sharp pain in the head, had nausea and flush like she s going o pass out, (points to the occipital area on the right). Brief, shooting, severe, like lightning, take her a few days to feel better, can be one zap or multiple then stops. Last time it happened was December 2020 or early January. Has not happened since. She has anxiety issues. She has arthritis in her neck but no radicular symptoms and ibuprofen helps. No nausea, no vomiting, no light or sound sensitivity, not positional, no vision changes. Discussed positioning, arthritis in the neck, physical therapy. She also has a lot of tightness in her cervical muscles. Nothing makes it better. No inciting events. No weakness. No cervical radicular symptoms. No other focal neurologic deficits, associated symptoms, inciting events or modifiable factors.  Reviewed notes, labs and imaging from outside physicians, which showed:  Cbc, cmp unremarkable glucose was elevated at 145 07/2018  I reviewed Dr. Marval Regal notes, she presented for headache, new onset headache and nausea and dizziness, no vomiting, no photophobia, no phonophobia, no aura no numbness or tingling, headache is located in the right occipital area, sharp, onset was a week prior to her appointment (last appointment September 25, 2019), severe and unchanged, sharp, came on quick, second episode 5 days later with sudden sharpness to left her feeling nauseated and dizzy and faint, her body feels flushed, and hot, she feels  sick to her stomach, she is going to pass out, the pain runs up her head in the back on one side, feels like fluid running up.  She had a similar episode 12 to 15 years ago and complete neurologic evaluation including imaging was normal.  Review of Systems: Patient complains of symptoms per HPI as well as the following symptoms: neckpain, knee pain, arthritis. Pertinent negatives and positives per HPI. All others negative.   Social History   Socioeconomic History  . Marital status: Widowed    Spouse name: Not on file  . Number of children: 2  . Years of education: 1 yr business school  . Highest education level: Not on file  Occupational History  . Not on file  Tobacco Use  . Smoking status: Never Smoker  . Smokeless tobacco: Never Used  Substance and Sexual Activity  . Alcohol use: No  . Drug use: No  . Sexual activity: Not on file  Other Topics Concern  . Not on file  Social History Narrative   Lives alone    Right handed   Caffeine: soda, 8 oz not daily though; tea not daily   Social Determinants of Health   Financial Resource Strain:   . Difficulty of Paying Living Expenses: Not on file  Food Insecurity:   . Worried About Charity fundraiser in the Last Year: Not on file  . Ran Out of Food in the Last Year: Not on file  Transportation Needs:   . Lack of Transportation (Medical): Not on file  . Lack of Transportation (Non-Medical): Not on file  Physical Activity:   .  Days of Exercise per Week: Not on file  . Minutes of Exercise per Session: Not on file  Stress:   . Feeling of Stress : Not on file  Social Connections:   . Frequency of Communication with Friends and Family: Not on file  . Frequency of Social Gatherings with Friends and Family: Not on file  . Attends Religious Services: Not on file  . Active Member of Clubs or Organizations: Not on file  . Attends Archivist Meetings: Not on file  . Marital Status: Not on file  Intimate Partner Violence:    . Fear of Current or Ex-Partner: Not on file  . Emotionally Abused: Not on file  . Physically Abused: Not on file  . Sexually Abused: Not on file    Family History  Problem Relation Age of Onset  . Lymphoma Mother   . Breast cancer Maternal Grandmother   . Stroke Maternal Grandfather   . Migraines Daughter   . Colon cancer Neg Hx     Past Medical History:  Diagnosis Date  . Allergic rhinitis, seasonal   . Allergies    dust  . Anxiety   . BMI 27.0-27.9,adult   . GERD (gastroesophageal reflux disease)   . History of kidney stones   . Hyperlipidemia LDL goal <100   . Panic attacks   . Vitamin D deficiency     Patient Active Problem List   Diagnosis Date Noted  . Occipital neuralgia of right side 11/14/2019  . GERD (gastroesophageal reflux disease) 05/25/2012    Past Surgical History:  Procedure Laterality Date  . ABDOMINAL HYSTERECTOMY      Current Outpatient Medications  Medication Sig Dispense Refill  . ALPRAZolam (XANAX) 0.5 MG tablet Take 0.25 mg by mouth as needed.    . Ascorbic Acid (VITAMIN C PO) Take by mouth.    Marland Kitchen aspirin 81 MG tablet Take 81 mg by mouth daily.    Marland Kitchen atorvastatin (LIPITOR) 20 MG tablet Take 20 mg by mouth daily.    . Multiple Vitamin (MULTIVITAMIN PO) Take by mouth.    Marland Kitchen VITAMIN D PO Take by mouth.    . gabapentin (NEURONTIN) 100 MG capsule Take 1 capsule (100 mg total) by mouth 3 (three) times daily as needed. 30 capsule 3   No current facility-administered medications for this visit.    Allergies as of 11/14/2019  . (No Known Allergies)    Vitals: BP 140/82 (BP Location: Left Arm, Patient Position: Sitting)   Pulse 84   Temp 97.7 F (36.5 C)   Ht 5\' 2"  (1.575 m)   Wt 154 lb (69.9 kg)   BMI 28.17 kg/m  Last Weight:  Wt Readings from Last 1 Encounters:  11/14/19 154 lb (69.9 kg)   Last Height:   Ht Readings from Last 1 Encounters:  11/14/19 5\' 2"  (1.575 m)     Physical exam: Exam: Gen: NAD, conversant, well  nourised, well groomed                     CV: RRR, no MRG. No Carotid Bruits. No peripheral edema, warm, nontender Eyes: Conjunctivae clear without exudates or hemorrhage  Neuro: Detailed Neurologic Exam  Speech:    Speech is normal; fluent and spontaneous with normal comprehension.  Cognition:    The patient is oriented to person, place, and time;     recent and remote memory intact;     language fluent;     normal attention, concentration,  fund of knowledge Cranial Nerves:    The pupils are equal, round, and reactive to light. Attempted fundoscopy could not visualize due to small pupils. Visual fields are full to finger confrontation. Extraocular movements are intact. Trigeminal sensation is intact and the muscles of mastication are normal. 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 The palate elevates in the midline. Hearing intact. Voice is normal. Shoulder shrug is normal. The tongue has normal motion without fasciculations.   Coordination:    Normal finger to nose   Gait:    normal native gait  Motor Observation:    No asymmetry, no atrophy, and no involuntary movements noted. Tone:    Normal muscle tone.    Posture:    Posture is normal. normal erect    Strength: Left prox leg weakness (chronic due to low back pain) otherwise strength is V/V in the upper and lower limbs.      Sensation: intact to LT     Reflex Exam:  DTR's:    Deep tendon reflexes in the upper and lower extremities are normal bilaterally.   Toes:    The toes are downgoing bilaterally.   Clonus:    Clonus is absent.    Assessment/Plan: This is a really lovely 69 year old female with occasional occipital neuralgia.  She was seen by neurology 10 years ago and had a thorough work-up including imaging which was negative, she has not had an episode since early January I think at this time we can hold off on any imaging.  I did discuss with her some of the causes, in her case likely due to  muscle spasms in the neck (she has some very tight cervical trapezius and other paraspinal muscles), she also has arthritic changes in the neck but no cervical radiculopathy or concerning symptoms.  At this point we will send her to physical therapy to see if they can help her neck issues and also provide dry needling to help with her tight paraspinal muscles.  I can give her some very low-dose gabapentin as needed.  Occipital neuralgia(ON). She needs PT on her neck,ON likely due to arthritic changes in the neck,so would like stretching, manual therapy/massage and dry needling.  Low-dose gabapentin as needed.  RTC as needed  Orders Placed This Encounter  Procedures  . Ambulatory referral to Physical Therapy   Meds ordered this encounter  Medications  . gabapentin (NEURONTIN) 100 MG capsule    Sig: Take 1 capsule (100 mg total) by mouth 3 (three) times daily as needed.    Dispense:  30 capsule    Refill:  3    Cc: Nicoletta Dress, MD,  Sarina Ill, MD  [facility] [address] [place], [place] [address]  Phone [phone] Fax [phone]

## 2019-11-14 NOTE — Patient Instructions (Addendum)
Occipital Neuralgia  Occipital neuralgia is a type of headache that causes brief episodes of very bad pain in the back of your head. Pain from occipital neuralgia may spread (radiate) to other parts of your head. These headaches may be caused by irritation of the nerves that leave your spinal cord high up in your neck, just below the base of your skull (occipital nerves). Your occipital nerves transmit sensations from the back of your head, the top of your head, and the areas behind your ears. What are the causes? This condition can occur without any known cause (primary headache syndrome). In other cases, this condition is caused by pressure on or irritation of one of the two occipital nerves. Pressure and irritation may be due to:  Muscle spasm in the neck.  Neck injury.  Wear and tear of the vertebrae in the neck (osteoarthritis).  Disease of the disks that separate the vertebrae.  Swollen blood vessels that put pressure on the occipital nerves.  Infections.  Tumors.  Diabetes. What are the signs or symptoms? This condition causes brief burning, stabbing, electric, shocking, or shooting pain which can radiate to the top of the head. It can happen on one side or both sides of the head. It can also cause:  Pain behind the eye.  Pain triggered by neck movement or hair brushing.  Scalp tenderness.  Aching in the back of the head between episodes of very bad pain.  Pain gets worse with exposure to bright lights. How is this diagnosed? There is no test that diagnoses this condition. Your health care provider may diagnose this condition based on a physical exam and your symptoms. Other tests may be done, such as:  Imaging studies of the brain and neck (cervical spine), such as an MRI or CT scan. These look for causes of pinched nerves.  Applying pressure to the nerves in the neck to try to re-create the pain.  Injection of numbing medicine into the occipital nerve areas to see if  pain goes away (diagnostic nerve block). How is this treated? Treatment for this condition may begin with simple measures, such as:  Rest.  Massage.  Applying heat or cold on the area.  Over-the-counter pain relievers. If these measures do not work, you may need other treatments, including:  Medicines, such as: ? Prescription-strength anti-inflammatory medicines. ? Muscle relaxants. ? Anti-seizure medicines, which can relieve pain. ? Antidepressants, which can relieve pain. ? Injected medicines, such as medicines that numb the area (local anesthetic) and steroids.  Pulsed radiofrequency ablation. This is when wires are implanted to deliver electrical impulses that block pain signals from the occipital nerve.  Surgery to relieve nerve pressure.  Physical therapy. Follow these instructions at home: Pain management      Avoid any activities that cause pain.  Rest when you have an attack of pain.  Try gentle massage to relieve pain.  Try a different pillow or sleeping position.  If directed, apply heat to the affected area as told by your health care provider. Use the heat source that your health care provider recommends, such as a moist heat pack or a heating pad. ? Place a towel between your skin and the heat source. ? Leave the heat on for 20-30 minutes. ? Remove the heat if your skin turns bright red. This is especially important if you are unable to feel pain, heat, or cold. You may have a greater risk of getting burned.  If directed, apply ice to the   back of the head and neck area as told by your health care provider. ? Put ice in a plastic bag. ? Place a towel between your skin and the bag. ? Leave the ice on for 20 minutes, 2-3 times per day. General instructions  Take over-the-counter and prescription medicines only as told by your health care provider.  Avoid things that make your symptoms worse, such as bright lights.  Try to stay active. Get regular  exercise that does not cause pain. Ask your health care provider to suggest safe exercises for you.  Work with a physical therapist to learn stretching exercises you can do at home.  Practice good posture.  Keep all follow-up visits as told by your health care provider. This is important. Contact a health care provider if:  Your medicine is not working.  You have new or worsening symptoms. Get help right away if:  You have very bad head pain that does not go away.  You have a sudden change in vision, balance, or speech. Summary  Occipital neuralgia is a type of headache that causes brief episodes of very bad pain in the back of your head.  Pain from occipital neuralgia may spread (radiate) to other parts of your head.  Treatment for this condition includes rest, massage, and medicines. This information is not intended to replace advice given to you by your health care provider. Make sure you discuss any questions you have with your health care provider. Document Revised: 08/23/2017 Document Reviewed: 11/11/2016 Elsevier Patient Education  [place].  Gabapentin capsules or tablets What is this medicine? GABAPENTIN (GA ba pen tin) is used to control seizures in certain types of epilepsy. It is also used to treat certain types of nerve pain. This medicine may be used for other purposes; ask your health care provider or pharmacist if you have questions. COMMON BRAND NAME(S): Active-PAC with Gabapentin, Gabarone, Neurontin What should I tell my health care provider before I take this medicine? They need to know if you have any of these conditions:  history of drug abuse or alcohol abuse problem  kidney disease  lung or breathing disease  suicidal thoughts, plans, or attempt; a previous suicide attempt by you or a family member  an unusual or allergic reaction to gabapentin, other medicines, foods, dyes, or preservatives  pregnant or trying to get  pregnant  breast-feeding How should I use this medicine? Take this medicine by mouth with a glass of water. Follow the directions on the prescription label. You can take it with or without food. If it upsets your stomach, take it with food. Take your medicine at regular intervals. Do not take it more often than directed. Do not stop taking except on your doctor's advice. If you are directed to break the 600 or 800 mg tablets in half as part of your dose, the extra half tablet should be used for the next dose. If you have not used the extra half tablet within 28 days, it should be thrown away. A special MedGuide will be given to you by the pharmacist with each prescription and refill. Be sure to read this information carefully each time. Talk to your pediatrician regarding the use of this medicine in children. While this drug may be prescribed for children as young as 3 years for selected conditions, precautions do apply. Overdosage: If you think you have taken too much of this medicine contact a poison control center or emergency room at once. NOTE: This medicine  is only for you. Do not share this medicine with others. What if I miss a dose? If you miss a dose, take it as soon as you can. If it is almost time for your next dose, take only that dose. Do not take double or extra doses. What may interact with this medicine? This medicine may interact with the following medications:  alcohol  antihistamines for allergy, cough, and cold  certain medicines for anxiety or sleep  certain medicines for depression like amitriptyline, fluoxetine, sertraline  certain medicines for seizures like phenobarbital, primidone  certain medicines for stomach problems  general anesthetics like halothane, isoflurane, methoxyflurane, propofol  local anesthetics like lidocaine, pramoxine, tetracaine  medicines that relax muscles for surgery  narcotic medicines for pain  phenothiazines like chlorpromazine,  mesoridazine, prochlorperazine, thioridazine This list may not describe all possible interactions. Give your health care provider a list of all the medicines, herbs, non-prescription drugs, or dietary supplements you use. Also tell them if you smoke, drink alcohol, or use illegal drugs. Some items may interact with your medicine. What should I watch for while using this medicine? Visit your doctor or health care provider for regular checks on your progress. You may want to keep a record at home of how you feel your condition is responding to treatment. You may want to share this information with your doctor or health care provider at each visit. You should contact your doctor or health care provider if your seizures get worse or if you have any new types of seizures. Do not stop taking this medicine or any of your seizure medicines unless instructed by your doctor or health care provider. Stopping your medicine suddenly can increase your seizures or their severity. This medicine may cause serious skin reactions. They can happen weeks to months after starting the medicine. Contact your health care provider right away if you notice fevers or flu-like symptoms with a rash. The rash may be red or purple and then turn into blisters or peeling of the skin. Or, you might notice a red rash with swelling of the face, lips or lymph nodes in your neck or under your arms. Wear a medical identification bracelet or chain if you are taking this medicine for seizures, and carry a card that lists all your medications. You may get drowsy, dizzy, or have blurred vision. Do not drive, use machinery, or do anything that needs mental alertness until you know how this medicine affects you. To reduce dizzy or fainting spells, do not sit or stand up quickly, especially if you are an older patient. Alcohol can increase drowsiness and dizziness. Avoid alcoholic drinks. Your mouth may get dry. Chewing sugarless gum or sucking hard  candy, and drinking plenty of water will help. The use of this medicine may increase the chance of suicidal thoughts or actions. Pay special attention to how you are responding while on this medicine. Any worsening of mood, or thoughts of suicide or dying should be reported to your health care provider right away. Women who become pregnant while using this medicine may enroll in the [place] Pregnancy Registry by calling [phone]. This registry collects information about the safety of antiepileptic drug use during pregnancy. What side effects may I notice from receiving this medicine? Side effects that you should report to your doctor or health care professional as soon as possible:  allergic reactions like skin rash, itching or hives, swelling of the face, lips, or tongue  breathing problems  rash,  fever, and swollen lymph nodes  redness, blistering, peeling or loosening of the skin, including inside the mouth  suicidal thoughts, mood changes Side effects that usually do not require medical attention (report to your doctor or health care professional if they continue or are bothersome):  dizziness  drowsiness  headache  nausea, vomiting  swelling of ankles, feet, hands  tiredness This list may not describe all possible side effects. Call your doctor for medical advice about side effects. You may report side effects to FDA at 1-800-FDA-1088. Where should I keep my medicine? Keep out of reach of children. This medicine may cause accidental overdose and death if it taken by other adults, children, or pets. Mix any unused medicine with a substance like cat litter or coffee grounds. Then throw the medicine away in a sealed container like a sealed bag or a coffee can with a lid. Do not use the medicine after the expiration date. Store at room temperature between 15 and 30 degrees C (59 and 86 degrees F). NOTE: This sheet is a summary. It may not cover all  possible information. If you have questions about this medicine, talk to your doctor, pharmacist, or health care provider.  2020 Elsevier/Gold Standard (2018-12-08 14:16:43)

## 2019-12-07 ENCOUNTER — Ambulatory Visit: Payer: PPO | Attending: Neurology | Admitting: Physical Therapy

## 2019-12-07 ENCOUNTER — Encounter: Payer: Self-pay | Admitting: Physical Therapy

## 2019-12-07 ENCOUNTER — Other Ambulatory Visit: Payer: Self-pay

## 2019-12-07 DIAGNOSIS — M542 Cervicalgia: Secondary | ICD-10-CM

## 2019-12-07 DIAGNOSIS — R42 Dizziness and giddiness: Secondary | ICD-10-CM | POA: Diagnosis not present

## 2019-12-08 NOTE — Therapy (Signed)
[facility] [address] [place], [place], [address] Phone: [phone]   Fax:  [phone]  Physical Therapy Evaluation  Patient Details  Name: Jasmin Owens MRN: [id number] Date of Birth: May 26, 1951 Referring Provider (PT): Melvenia Beam, MD   Encounter Date: 12/07/2019  PT End of Session - 12/08/19 1458    Visit Number  1    Number of Visits  13    Date for PT Re-Evaluation  02/06/20    Authorization Type  [employer]; $15 copay    Progress Note Due on Visit  10    PT Start Time  0858    PT Stop Time  0940    PT Time Calculation (min)  42 min    Activity Tolerance  Patient tolerated treatment well    Behavior During Therapy  [facility] for tasks assessed/performed       Past Medical History:  Diagnosis Date  . Allergic rhinitis, seasonal   . Allergies    dust  . Anxiety   . BMI 27.0-27.9,adult   . GERD (gastroesophageal reflux disease)   . History of kidney stones   . Hyperlipidemia LDL goal <100   . Panic attacks   . Vitamin D deficiency     Past Surgical History:  Procedure Laterality Date  . ABDOMINAL HYSTERECTOMY      There were no vitals filed for this visit.   Subjective Assessment - 12/07/19 0902    Subjective  10-12 years ago pt had sharp pain go up her neck and back of her head with dizziness - had full work up nothing abnormal found - took 2 months to resolved.  Stayed away for years and then most recent episode occured 2 times in 5 days - December 5th was at church decorating for Christmas.  Presented the same way - sharp pain in back of head and dizziness.  No longer having head/neck pain and has not had any dizziness in last two weeks.    Pertinent History  Fall from roof 15 years ago with L facial fracture and +LOC; allergies, anxiety, GERD, kidney stones, HLD, panic attacks, Vit D deficiency    Patient Stated Goals  Is there a way to prevent this from happening again?    Currently  in Pain?  No/denies         [facility] PT Assessment - 12/07/19 0910      Assessment   Medical Diagnosis  Occipital neuralgia     Referring Provider (PT)  Melvenia Beam, MD    Onset Date/Surgical Date  11/14/19    Prior Therapy  unknown      Precautions   Precautions  Other (comment)    Precaution Comments  Fall from roof 15 years ago with L facial fracture and +LOC; allergies, anxiety, GERD, kidney stones, HLD, panic attacks, Vit D deficiency      Balance Screen   Has the patient fallen in the past 6 months  No   but had a bad fall from roof 15 years ago, +LOC     [place] residence    Living Arrangements  Alone    Type of [place] on a [place] farm; has a hard time lifting heavy items - ex. 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  Does like to walk      Observation/Other Assessments   Focus on Therapeutic Outcomes (FOTO)   Not assessed      Sensation   Light Touch  Appears Intact    Additional Comments  tingling in UE if lifting heavy objects      Coordination   Gross Motor Movements are Fluid and Coordinated  Yes    Fine Motor Movements are Fluid and Coordinated  Yes      Posture/Postural Control   Posture/Postural Control  Postural limitations    Postural Limitations  Increased thoracic kyphosis      ROM / Strength   AROM / PROM / Strength  Strength;AROM      AROM   Overall AROM   Deficits    AROM Assessment Site  Cervical    Cervical Flexion  30    Cervical Extension  30    Cervical - Right Side Bend  30    Cervical - Left Side Bend  35    Cervical - Right Rotation  40    Cervical - Left Rotation  50       Strength   Overall Strength  Within functional limits for tasks performed    Overall Strength Comments  5/5 except hip flexion 4-/5      Palpation   Palpation comment  Increased mm tension in cervical/thoracic para-spinal muscles, upper trapezius, sub occipital muscles and mid trap/rhomboids - no significant pain or dizziness with palpation                 Objective measurements completed on examination: See above findings.              PT Education - 12/08/19 1453    Education Details  clinical findings, PT POC and goals    Person(s) Educated  Patient    Methods  Explanation    Comprehension  Verbalized understanding       PT Short Term Goals - 12/08/19 1510      PT SHORT TERM GOAL #1   Title  Pt will participate in full vestibular assessment and will initiate vestibular HEP if indicated    Time  4    Period  Weeks    Status  New    Target Date  01/07/20      PT SHORT TERM GOAL #2   Title  Pt will demonstrate compliance with initial HEP focusing on cervicothoracic and shoulder ROM, strengthening and posture    Time  4    Period  Weeks    Status  New    Target Date  01/07/20      PT SHORT TERM GOAL #3   Title  Pt will demonstrate 8-10 degree increase in cervical flexion/extension, R/L sidebending and R/L rotation    Baseline  30/30, 30/35, 40/50    Time  4    Period  Weeks    Status  New    Target Date  01/07/20        PT Long Term Goals - 12/08/19 1513      PT LONG TERM GOAL #1   Title  Pt will demonstrate independence with final HEP and will report performing intermittent stretches throughout work day to decrease tension  Time  8    Period  Weeks    Status  New    Target Date  02/06/20      PT LONG TERM GOAL #2   Title  Vestibular goal if appropriate    Baseline  TBD    Time  8    Period  Weeks    Status  New    Target Date  02/06/20      PT LONG TERM GOAL #3   Title  Pt will demonstrate 12-15 deg increase in cervical ROM     Baseline  30/30,  30/35, 40/50    Time  8    Period  Weeks    Status  New    Target Date  02/06/20      PT LONG TERM GOAL #4   Title  Pt will report ability to pick up grandson or heavy items on the farm without neck pain or UE tingling    Time  8    Period  Weeks    Status  New    Target Date  02/06/20             Plan - 12/08/19 1500    Clinical Impression Statement  Pt is a 69 year old female referred to Neuro OPPT for evaluation of occipital neuralgia and dizziness.  Pt's PMH is significant for the following: Fall from roof 15 years ago with L facial fracture and +LOC; allergies, anxiety, GERD, kidney stones, HLD, panic attacks, Vit D deficiency. The following deficits were noted during pt's exam: increased tenderness to palpation and trigger points in muscles around neck and shoulders bilaterally, impaired posture, impaired cervical ROM, and dizziness.  Pt would benefit from skilled PT to address these impairments and functional limitations to maximize functional mobility independence and reduce falls risk.    Personal Factors and Comorbidities  Comorbidity 3+;Fitness;Profession;Social Background    Comorbidities  Fall from roof 15 years ago with L facial fracture and +LOC; allergies, anxiety, GERD, kidney stones, HLD, panic attacks, Vit D deficiency    Examination-Activity Limitations  Carry;Lift    Examination-Participation Restrictions  Cleaning;Yard Work    Stability/Clinical Decision Making  Stable/Uncomplicated    Designer, jewellery  Low    Rehab Potential  Good    PT Frequency  2x / week   2x/week x 4, 1x/week x 4 if pt is making good progress   PT Duration  8 weeks    PT Treatment/Interventions  ADLs/Self Care Home Management;Cryotherapy;Moist Heat;Functional mobility training;Therapeutic activities;Therapeutic exercise;Balance training;Neuromuscular re-education;Patient/family education;Manual techniques;Passive range of motion;Dry needling;Vestibular;Taping     PT Next Visit Plan  I will perform vestibular evaluation next time I see her; educate on dry needling.  Gentle mobs to neck and thoracic spine - she is nervous about manipulation due to prior experiences.  Initiate cervical and shoulder ROM, posture exercises.    Consulted and Agree with Plan of Care  Patient       Patient will benefit from skilled therapeutic intervention in order to improve the following deficits and impairments:  Decreased range of motion, Dizziness, Postural dysfunction, Pain  Visit Diagnosis: Cervicalgia  Dizziness and giddiness     Problem List Patient Active Problem List   Diagnosis Date Noted  . Occipital neuralgia of right side 11/14/2019  . GERD (gastroesophageal reflux disease) 05/25/2012    Rico Junker, PT, DPT 12/08/19    3:17 PM    [facility] [address]  [place], [place], [address] Phone: [phone]   Fax:  [phone]  Name: Jasmin Owens MRN: [id number] Date of Birth: [birth year]

## 2019-12-27 ENCOUNTER — Ambulatory Visit: Payer: PPO | Attending: Neurology | Admitting: Rehabilitation

## 2019-12-27 ENCOUNTER — Encounter: Payer: Self-pay | Admitting: Rehabilitation

## 2019-12-27 ENCOUNTER — Ambulatory Visit
Admission: RE | Admit: 2019-12-27 | Discharge: 2019-12-27 | Disposition: A | Discharge status: Home / Self Care | Payer: PPO | Source: Ambulatory Visit | Attending: Gynecology | Admitting: Gynecology

## 2019-12-27 ENCOUNTER — Other Ambulatory Visit: Payer: Self-pay

## 2019-12-27 DIAGNOSIS — R42 Dizziness and giddiness: Secondary | ICD-10-CM | POA: Diagnosis not present

## 2019-12-27 DIAGNOSIS — E2839 Other primary ovarian failure: Secondary | ICD-10-CM

## 2019-12-27 DIAGNOSIS — M542 Cervicalgia: Secondary | ICD-10-CM | POA: Diagnosis not present

## 2019-12-27 DIAGNOSIS — M85851 Other specified disorders of bone density and structure, right thigh: Secondary | ICD-10-CM | POA: Diagnosis not present

## 2019-12-27 DIAGNOSIS — Z780 Asymptomatic menopausal state: Secondary | ICD-10-CM | POA: Diagnosis not present

## 2019-12-27 NOTE — Patient Instructions (Signed)
Access Code: [email or web address] URL: [email or web address] Date: 12/27/2019 Prepared by: Cameron Sprang  Exercises Seated Shoulder Shrug Circles AROM Backward - 1 x daily - 7 x weekly - 1 sets - 10 reps Seated Cervical Sidebending Stretch - 1 x daily - 7 x weekly - 1 sets - 2 reps - 30-60 secs hold Seated Assisted Cervical Rotation with Towel - 1 x daily - 7 x weekly - 1 sets - 10 reps Seated Thoracic Lumbar Extension with Pectoralis Stretch - 1 x daily - 7 x weekly - 3 sets - 10 reps

## 2019-12-27 NOTE — Therapy (Signed)
[facility] [address] [place], [place], [address] Phone: [phone]   Fax:  [phone]  Physical Therapy Treatment  Patient Details  Name: Jasmin Owens MRN: [id number] Date of Birth: 06/30/1951 Referring Provider (PT): Melvenia Beam, MD   Encounter Date: 12/27/2019  PT End of Session - 12/27/19 1928    Visit Number  2    Number of Visits  13    Date for PT Re-Evaluation  02/06/20    Authorization Type  [employer]; $15 copay    Progress Note Due on Visit  10    PT Start Time  [id number]    PT Stop Time  1832    PT Time Calculation (min)  45 min    Activity Tolerance  Patient tolerated treatment well    Behavior During Therapy  [facility] for tasks assessed/performed       Past Medical History:  Diagnosis Date  . Allergic rhinitis, seasonal   . Allergies    dust  . Anxiety   . BMI 27.0-27.9,adult   . GERD (gastroesophageal reflux disease)   . History of kidney stones   . Hyperlipidemia LDL goal <100   . Panic attacks   . Vitamin D deficiency     Past Surgical History:  Procedure Laterality Date  . ABDOMINAL HYSTERECTOMY      There were no vitals filed for this visit.  Subjective Assessment - 12/27/19 1749    Subjective  Has not had any sharp pain or dizziness since last visit.    Pertinent History  Fall from roof 15 years ago with L facial fracture and +LOC; allergies, anxiety, GERD, kidney stones, HLD, panic attacks, Vit D deficiency    Patient Stated Goals  Is there a way to prevent this from happening again?    Currently in Pain?  No/denies                       [facility] Adult PT Treatment/Exercise - 12/27/19 1923      Self-Care   Self-Care  Posture    Posture  Discussed ergonomics with work space set up and how knees/hips should  be 90/90 deg and how to do this via chair adjustment and step stool as needed.  Having monitor in front of face for forward gaze and keyboard at waist  level.  Discussed how to adjust monitor as needed.  Also recommended she have a co-worker take a picture of her at workspace so we may better educate on positioning.  Pt verbalized understanding.         Exercises   Exercises  Other Exercises    Other Exercises   Provided inital HEP for postural strengthening, cervical and thoracic ROM/stretches.  See pt instruction for details.        Manual Therapy   Manual Therapy  Joint mobilization;Soft tissue mobilization    Manual therapy comments  to reduce pain and improve ROM and flexibility    Joint Mobilization  supine grade II/III PA mobs to cervical and upper thoracic spine.  Note marked stiffness in upper thoracic spine therefore had pt get into prone position and performed more grade III PA mobs to lower cervical and upper thoracic spine.  Pt tolerated well.     Soft tissue mobilization  STM to B upper traps, B levator and B rhomboids         Access Code: [email or web address] URL: [email or web address] Date: 12/27/2019 Prepared by: Cameron Sprang  Exercises Seated Shoulder Shrug Circles AROM Backward - 1 x daily - 7 x weekly - 1 sets - 10 reps Seated Cervical Sidebending Stretch - 1 x daily - 7 x weekly - 1 sets - 2 reps - 30-60 secs hold Seated Assisted Cervical Rotation with Towel - 1 x daily - 7 x weekly - 1 sets - 10 reps Seated Thoracic Lumbar Extension with Pectoralis Stretch - 1 x daily - 7 x weekly - 3 sets - 10 reps     PT Education - 12/27/19 1927    Education Details  HEP, [employer], [employer]) Educated  Patient    Methods  Demonstration;Explanation;Handout    Comprehension  Verbalized understanding;Returned demonstration;Verbal cues required;Tactile cues required       PT Short Term Goals - 12/08/19 1510      PT SHORT TERM GOAL #1   Title  Pt will participate in full vestibular assessment and will initiate vestibular HEP if indicated    Time  4    Period  Weeks    Status  New    Target Date  01/07/20       PT SHORT TERM GOAL #2   Title  Pt will demonstrate compliance with initial HEP focusing on cervicothoracic and shoulder ROM, strengthening and posture    Time  4    Period  Weeks    Status  New    Target Date  01/07/20      PT SHORT TERM GOAL #3   Title  Pt will demonstrate 8-10 degree increase in cervical flexion/extension, R/L sidebending and R/L rotation    Baseline  30/30, 30/35, 40/50    Time  4    Period  Weeks    Status  New    Target Date  01/07/20        PT Long Term Goals - 12/08/19 1513      PT LONG TERM GOAL #1   Title  Pt will demonstrate independence with final HEP and will report performing intermittent stretches throughout work day to decrease tension    Time  8    Period  Weeks    Status  New    Target Date  02/06/20      PT LONG TERM GOAL #2   Title  Vestibular goal if appropriate    Baseline  TBD    Time  8    Period  Weeks    Status  New    Target Date  02/06/20      PT LONG TERM GOAL #3   Title  Pt will demonstrate 12-15 deg increase in cervical ROM    Baseline  30/30,  30/35, 40/50    Time  8    Period  Weeks    Status  New    Target Date  02/06/20      PT LONG TERM GOAL #4   Title  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  Personal Factors and Comorbidities  Comorbidity 3+;Fitness;Profession;Social Background    Comorbidities  Fall from roof 15 years ago with L facial fracture and +LOC; allergies, anxiety, GERD, kidney stones, HLD, panic attacks, Vit D deficiency    Examination-Activity Limitations  Carry;Lift     Examination-Participation Restrictions  Cleaning;Yard Work    Stability/Clinical Decision Making  Stable/Uncomplicated    Rehab Potential  Good    PT Frequency  2x / week   2x/week x 4, 1x/week x 4 if pt is making good progress   PT Duration  8 weeks    PT Treatment/Interventions  ADLs/Self Care Home Management;Cryotherapy;Moist Heat;Functional mobility training;Therapeutic activities;Therapeutic exercise;Balance training;Neuromuscular re-education;Patient/family education;Manual techniques;Passive range of motion;Dry needling;Vestibular;Taping    PT Next Visit Plan  I talked to her a little about dry needling, check HEP as needed, I will perform vestibular evaluation next time I see her; educate on dry needling.  Gentle mobs to neck and thoracic spine - she is nervous about manipulation due to prior experiences.  Initiate cervical and shoulder ROM, posture exercises.    Consulted and Agree with Plan of Care  Patient       Patient will benefit from skilled therapeutic intervention in order to improve the following deficits and impairments:  Decreased range of motion, Dizziness, Postural dysfunction, Pain  Visit Diagnosis: Cervicalgia     Problem List Patient Active Problem List   Diagnosis Date Noted  . Occipital neuralgia of right side 11/14/2019  . GERD (gastroesophageal reflux disease) 05/25/2012    Cameron Sprang, PT, MPT [facility] [address] [place], [place], [address] Phone: [phone]   Fax:  [phone] 12/27/19, 7:30 PM  Name: Jasmin Owens MRN: [id number] Date of Birth: 07-26-1951

## 2019-12-31 ENCOUNTER — Ambulatory Visit: Payer: PPO | Admitting: Physical Therapy

## 2019-12-31 ENCOUNTER — Telehealth: Payer: Self-pay | Admitting: Physical Therapy

## 2019-12-31 NOTE — Telephone Encounter (Signed)
Contacted pt by phone regarding missed visit today.  Pt had appointment written down as tomorrow.  Pt lives too far away to arrive in time before clinic closed.  Attempted to find another time this week to make up missed visit.  No times found that work for patient's schedule.  Will place pt on wait list for Wednesday or Friday.  Reminded pt of her appointment on Thursday.  Pt verbalized understanding.  Rico Junker, PT, DPT 12/31/19    6:09 PM

## 2020-01-03 ENCOUNTER — Encounter: Payer: Self-pay | Admitting: Rehabilitation

## 2020-01-03 ENCOUNTER — Other Ambulatory Visit: Payer: Self-pay

## 2020-01-03 ENCOUNTER — Ambulatory Visit: Payer: PPO | Admitting: Rehabilitation

## 2020-01-03 DIAGNOSIS — M542 Cervicalgia: Secondary | ICD-10-CM

## 2020-01-03 NOTE — Therapy (Signed)
[facility] [address] [place], [place], [address] Phone: [phone]   Fax:  [phone]  Physical Therapy Treatment  Patient Details  Name: Jasmin Owens MRN: [id number] Date of Birth: May 10, 1951 Referring Provider (PT): Melvenia Beam, MD   Encounter Date: 01/03/2020  PT End of Session - 01/03/20 2155    Visit Number  3    Number of Visits  13    Date for PT Re-Evaluation  02/06/20    Authorization Type  [employer]; $15 copay    Progress Note Due on Visit  10    PT Start Time  [id number]   PT was late from last session   PT Stop Time  1830    PT Time Calculation (min)  32 min    Activity Tolerance  Patient tolerated treatment well    Behavior During Therapy  [facility] for tasks assessed/performed       Past Medical History:  Diagnosis Date  . Allergic rhinitis, seasonal   . Allergies    dust  . Anxiety   . BMI 27.0-27.9,adult   . GERD (gastroesophageal reflux disease)   . History of kidney stones   . Hyperlipidemia LDL goal <100   . Panic attacks   . Vitamin D deficiency     Past Surgical History:  Procedure Laterality Date  . ABDOMINAL HYSTERECTOMY      There were no vitals filed for this visit.  Subjective Assessment - 01/03/20 1800    Subjective  Has had many appts in the last week, so did not mean to miss last session.    Pertinent History  Fall from roof 15 years ago with L facial fracture and +LOC; allergies, anxiety, GERD, kidney stones, HLD, panic attacks, Vit D deficiency    Patient Stated Goals  Is there a way to prevent this from happening again?    Currently in Pain?  Yes    Pain Score  1     Pain Location  Neck    Pain Descriptors / Indicators  Aching;Nagging    Pain Type  Chronic pain    Pain Onset  More than a month ago    Pain Frequency  Intermittent    Aggravating Factors   not moving    Pain Relieving Factors  moving more                       [facility] Adult  PT Treatment/Exercise - 01/03/20 1819      Exercises   Exercises  Other Exercises    Other Exercises   Reviewed current HEP as listed.  Good recall.  Supine pool noodle under spine for anterior chest stretch x 1 mins in "T" and 1 min in "Y" , added to HEP.  While on pool noodle, performed supine shoulder extension with red theraband (PT providing resistance) x 10 reps.  Standing shoulder extension x 10 reps with light tactile cues for shoulder depression and cues for elbow extension.  Standing scap retraction with red band x 10 reps, again with light cues for postural awareness.  Added standing therex to HEP.           Access Code: [email or web address] URL: [email or web address] Date: 01/03/2020 Prepared by: Cameron Sprang  Exercises Seated Shoulder Shrug Circles AROM Backward - 1 x daily - 7 x weekly - 1 sets - 10 reps Seated Cervical Sidebending Stretch - 1 x daily - 7 x weekly - 1 sets - 2  reps - 30-60 secs hold Seated Assisted Cervical Rotation with Towel - 1 x daily - 7 x weekly - 1 sets - 10 reps Seated Thoracic Lumbar Extension with Pectoralis Stretch - 1 x daily - 7 x weekly - 3 sets - 10 reps Supine Chest Stretch on Foam Roll - 1 x daily - 7 x weekly - 1 sets - 1 reps - 2 mins hold Single Arm Shoulder Extension with Anchored Resistance - 1 x daily - 7 x weekly - 1 sets - 10 reps - 2-3 secs hold Scapular Retraction with Resistance - 1 x daily - 7 x weekly - 1 sets - 10 reps      PT Education - 01/03/20 2155    Education Details  updated HEP, continue to educate to bring in pic of work set up    [employer]) Educated  Patient    Methods  Explanation;Demonstration;Handout    Comprehension  Verbalized understanding;Returned demonstration;Need further instruction       PT Short Term Goals - 12/08/19 1510      PT SHORT TERM GOAL #1   Title  Pt will participate in full vestibular assessment and will initiate vestibular HEP if indicated    Time  4    Period  Weeks    Status   New    Target Date  01/07/20      PT SHORT TERM GOAL #2   Title  Pt will demonstrate compliance with initial HEP focusing on cervicothoracic and shoulder ROM, strengthening and posture    Time  4    Period  Weeks    Status  New    Target Date  01/07/20      PT SHORT TERM GOAL #3   Title  Pt will demonstrate 8-10 degree increase in cervical flexion/extension, R/L sidebending and R/L rotation    Baseline  30/30, 30/35, 40/50    Time  4    Period  Weeks    Status  New    Target Date  01/07/20        PT Long Term Goals - 12/08/19 1513      PT LONG TERM GOAL #1   Title  Pt will demonstrate independence with final HEP and will report performing intermittent stretches throughout work day to decrease tension    Time  8    Period  Weeks    Status  New    Target Date  02/06/20      PT LONG TERM GOAL #2   Title  Vestibular goal if appropriate    Baseline  TBD    Time  8    Period  Weeks    Status  New    Target Date  02/06/20      PT LONG TERM GOAL #3   Title  Pt will demonstrate 12-15 deg increase in cervical ROM    Baseline  30/30,  30/35, 40/50    Time  8    Period  Weeks    Status  New    Target Date  02/06/20      PT LONG TERM GOAL #4   Title  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  Added supine anterior chest stretch and standing shoulder/scapular strengthening to HEP.  Pt tolerated very well.    Personal Factors and Comorbidities  Comorbidity 3+;Fitness;Profession;Social Background    Comorbidities  Fall from roof 15 years ago with L facial fracture and +LOC; allergies, anxiety, GERD, kidney stones, HLD, panic attacks, Vit D deficiency    Examination-Activity Limitations   Carry;Lift    Examination-Participation Restrictions  Cleaning;Yard Work    Stability/Clinical Decision Making  Stable/Uncomplicated    Rehab Potential  Good    PT Frequency  2x / week   2x/week x 4, 1x/week x 4 if pt is making good progress   PT Duration  8 weeks    PT Treatment/Interventions  ADLs/Self Care Home Management;Cryotherapy;Moist Heat;Functional mobility training;Therapeutic activities;Therapeutic exercise;Balance training;Neuromuscular re-education;Patient/family education;Manual techniques;Passive range of motion;Dry needling;Vestibular;Taping    PT Next Visit Plan  I talked to her a little about dry needling, check HEP as needed, I will perform vestibular evaluation next time I see her; educate on dry needling.  Gentle mobs to neck and thoracic spine - she is nervous about manipulation due to prior experiences.  Initiate cervical and shoulder ROM, posture exercises.    PT Home Exercise Plan  [email or web address]    Consulted and Agree with Plan of Care  Patient       Patient will benefit from skilled therapeutic intervention in order to improve the following deficits and impairments:  Decreased range of motion, Dizziness, Postural dysfunction, Pain  Visit Diagnosis: Cervicalgia     Problem List Patient Active Problem List   Diagnosis Date Noted  . Occipital neuralgia of right side 11/14/2019  . GERD (gastroesophageal reflux disease) 05/25/2012    Cameron Sprang, PT, MPT [facility] [address] [place], [place], [address] Phone: [phone]   Fax:  [phone] 01/03/20, 10:00 PM  Name: Jasmin Owens MRN: [id number] Date of Birth: January 07, 1951

## 2020-01-03 NOTE — Patient Instructions (Signed)
Access Code: [email or web address] URL: [email or web address] Date: 01/03/2020 Prepared by: Cameron Sprang  Exercises Seated Shoulder Shrug Circles AROM Backward - 1 x daily - 7 x weekly - 1 sets - 10 reps Seated Cervical Sidebending Stretch - 1 x daily - 7 x weekly - 1 sets - 2 reps - 30-60 secs hold Seated Assisted Cervical Rotation with Towel - 1 x daily - 7 x weekly - 1 sets - 10 reps Seated Thoracic Lumbar Extension with Pectoralis Stretch - 1 x daily - 7 x weekly - 3 sets - 10 reps Supine Chest Stretch on Foam Roll - 1 x daily - 7 x weekly - 1 sets - 1 reps - 2 mins hold Single Arm Shoulder Extension with Anchored Resistance - 1 x daily - 7 x weekly - 1 sets - 10 reps - 2-3 secs hold Scapular Retraction with Resistance - 1 x daily - 7 x weekly - 1 sets - 10 reps

## 2020-01-07 ENCOUNTER — Encounter: Payer: Self-pay | Admitting: Physical Therapy

## 2020-01-07 ENCOUNTER — Ambulatory Visit: Payer: PPO | Admitting: Physical Therapy

## 2020-01-07 ENCOUNTER — Other Ambulatory Visit: Payer: Self-pay

## 2020-01-07 DIAGNOSIS — M542 Cervicalgia: Secondary | ICD-10-CM | POA: Diagnosis not present

## 2020-01-07 NOTE — Patient Instructions (Addendum)
Trigger Point Dry Needling  . What is Trigger Point Dry Needling (DN)? o DN is a physical therapy technique used to treat muscle pain and dysfunction. Specifically, DN helps deactivate muscle trigger points (muscle knots).  o A thin filiform needle is used to penetrate the skin and stimulate the underlying trigger point. The goal is for a local twitch response (LTR) to occur and for the trigger point to relax. No medication of any kind is injected during the procedure.   . What Does Trigger Point Dry Needling Feel Like?  o The procedure feels different for each individual patient. Some patients report that they do not actually feel the needle enter the skin and overall the process is not painful. Very mild bleeding may occur. However, many patients feel a deep cramping in the muscle in which the needle was inserted. This is the local twitch response.   Marland Kitchen How Will I feel after the treatment? o Soreness is normal, and the onset of soreness may not occur for a few hours. Typically this soreness does not last longer than two days.  o Bruising is uncommon, however; ice can be used to decrease any possible bruising.  o In rare cases feeling tired or nauseous after the treatment is normal. In addition, your symptoms may get worse before they get better, this period will typically not last longer than 24 hours.   . What Can I do After My Treatment? o Increase your hydration by drinking more water for the next 24 hours. o You may place ice or heat on the areas treated that have become sore, however, do not use heat on inflamed or bruised areas. Heat often brings more relief post needling. o You can continue your regular activities, but vigorous activity is not recommended initially after the treatment for 24 hours. o DN is best combined with other physical therapy such as strengthening, stretching, and other therapies    Access Code: [email or web address] URL: [email or web address] Date:  01/07/2020 Prepared by: Misty Stanley  Exercises Seated Shoulder Shrug Circles AROM Backward - 1 x daily - 7 x weekly - 1 sets - 10 reps Seated Cervical Sidebending Stretch - 1 x daily - 7 x weekly - 1 sets - 2 reps - 30-60 secs hold Seated Assisted Cervical Rotation with Towel - 1 x daily - 7 x weekly - 1 sets - 10 reps Seated Thoracic Lumbar Extension with Pectoralis Stretch - 1 x daily - 7 x weekly - 3 sets - 10 reps Supine Chest Stretch on Foam Roll - 1 x daily - 7 x weekly - 1 sets - 1 reps - 2 mins hold Single Arm Shoulder Extension with Anchored Resistance - 1 x daily - 7 x weekly - 1 sets - 10 reps - 2-3 secs hold Single Arm Row with Trunk Rotation - 1 x daily - 7 x weekly - 2 sets - 10 reps Doorway Pec Stretch at 60 Elevation - 1 x daily - 7 x weekly - 2 sets - 20 second hold

## 2020-01-07 NOTE — Therapy (Signed)
[facility] [address] [place], [place], [address] Phone: [phone]   Fax:  [phone]  Physical Therapy Treatment  Patient Details  Name: Jasmin Owens MRN: [id number] Date of Birth: 05-Apr-1951 Referring Provider (PT): Melvenia Beam, MD   Encounter Date: 01/07/2020  PT End of Session - 01/07/20 1818    Visit Number  4    Number of Visits  13    Date for PT Re-Evaluation  02/06/20    Authorization Type  [employer]; $15 copay    Progress Note Due on Visit  10    PT Start Time  1534    PT Stop Time  1615    PT Time Calculation (min)  41 min    Activity Tolerance  Patient tolerated treatment well    Behavior During Therapy  [facility] for tasks assessed/performed       Past Medical History:  Diagnosis Date  . Allergic rhinitis, seasonal   . Allergies    dust  . Anxiety   . BMI 27.0-27.9,adult   . GERD (gastroesophageal reflux disease)   . History of kidney stones   . Hyperlipidemia LDL goal <100   . Panic attacks   . Vitamin D deficiency     Past Surgical History:  Procedure Laterality Date  . ABDOMINAL HYSTERECTOMY      There were no vitals filed for this visit.  Subjective Assessment - 01/07/20 1538    Subjective  Had a house full this past weekend so didn't get to do many of the exercises.  Did the simple ones.  It does feel looser but still some pain on L side of neck.    Pertinent History  Fall from roof 15 years ago with L facial fracture and +LOC; allergies, anxiety, GERD, kidney stones, HLD, panic attacks, Vit D deficiency    Patient Stated Goals  Is there a way to prevent this from happening again?    Currently in Pain?  Yes    Pain Onset  More than a month ago                       [facility] Adult PT Treatment/Exercise - 01/07/20 1552      Exercises   Exercises  Other Exercises    Other Exercises   To encourage thoracic rotation pt performed in supine upper trunk  rotation keeping lower trunk and pelvis stable with arms in a T, RUE reaching for L and LUE reaching for R, 2 sets x 10 reps.  Performed lower trunk rotation keeping upper trunk stable in a T, RLE straight and rotating across midline x 10 seconds, LLE straight rotating across midline x 10 seconds - 2 sets.  Also performed one set of thread the needle for thoracic opening with UE on table in modified plank positionin standing, 10 each side.  On R side pt demonstrated decreased scapular retraction and anterior tipping of shoulder.  Also demonstrated and return demonstrated doorway stretch for pectoralis stretch        Trigger Point Dry Needling  . What is Trigger Point Dry Needling (DN)? o DN is a physical therapy technique used to treat muscle pain and dysfunction. Specifically, DN helps deactivate muscle trigger points (muscle knots).  o A thin filiform needle is used to penetrate the skin and stimulate the underlying trigger point. The goal is for a local twitch response (LTR) to occur and for the trigger point to relax. No  medication of any kind is injected during the procedure.   . What Does Trigger Point Dry Needling Feel Like?  o The procedure feels different for each individual patient. Some patients report that they do not actually feel the needle enter the skin and overall the process is not painful. Very mild bleeding may occur. However, many patients feel a deep cramping in the muscle in which the needle was inserted. This is the local twitch response.   Marland Kitchen How Will I feel after the treatment? o Soreness is normal, and the onset of soreness may not occur for a few hours. Typically this soreness does not last longer than two days.  o Bruising is uncommon, however; ice can be used to decrease any possible bruising.  o In rare cases feeling tired or nauseous after the treatment is normal. In addition, your symptoms may get worse before they get better, this period will typically not last longer  than 24 hours.   . What Can I do After My Treatment? o Increase your hydration by drinking more water for the next 24 hours. o You may place ice or heat on the areas treated that have become sore, however, do not use heat on inflamed or bruised areas. Heat often brings more relief post needling. o You can continue your regular activities, but vigorous activity is not recommended initially after the treatment for 24 hours. o DN is best combined with other physical therapy such as strengthening, stretching, and other therapies    Access Code: [email or web address] URL: [email or web address] Date: 01/07/2020 Prepared by: Misty Stanley  Exercises Seated Shoulder Shrug Circles AROM Backward - 1 x daily - 7 x weekly - 1 sets - 10 reps Seated Cervical Sidebending Stretch - 1 x daily - 7 x weekly - 1 sets - 2 reps - 30-60 secs hold Seated Assisted Cervical Rotation with Towel - 1 x daily - 7 x weekly - 1 sets - 10 reps Seated Thoracic Lumbar Extension with Pectoralis Stretch - 1 x daily - 7 x weekly - 3 sets - 10 reps Supine Chest Stretch on Foam Roll - 1 x daily - 7 x weekly - 1 sets - 1 reps - 2 mins hold Single Arm Shoulder Extension with Anchored Resistance - 1 x daily - 7 x weekly - 1 sets - 10 reps - 2-3 secs hold Single Arm Row with Trunk Rotation - 1 x daily - 7 x weekly - 2 sets - 10 reps Doorway Pec Stretch at 60 Elevation - 1 x daily - 7 x weekly - 2 sets - 20 second hold      PT Education - 01/07/20 1817    Education Details  updated rows, added anterior chest stretch, thoracic mobility; educated on dry needling - may utilize next session if pt is aggreable    Person(s) Educated  Patient    Methods  Explanation;Demonstration;Handout    Comprehension  Verbalized understanding;Returned demonstration       PT Short Term Goals - 12/08/19 1510      PT SHORT TERM GOAL #1   Title  Pt will participate in full vestibular assessment and will initiate vestibular HEP if indicated     Time  4    Period  Weeks    Status  New    Target Date  01/07/20      PT SHORT TERM GOAL #2   Title  Pt will demonstrate compliance with initial HEP focusing on cervicothoracic and shoulder ROM, strengthening and posture  Time  4    Period  Weeks    Status  New    Target Date  01/07/20      PT SHORT TERM GOAL #3   Title  Pt will demonstrate 8-10 degree increase in cervical flexion/extension, R/L sidebending and R/L rotation    Baseline  30/30, 30/35, 40/50    Time  4    Period  Weeks    Status  New    Target Date  01/07/20        PT Long Term Goals - 12/08/19 1513      PT LONG TERM GOAL #1   Title  Pt will demonstrate independence with final HEP and will report performing intermittent stretches throughout work day to decrease tension    Time  8    Period  Weeks    Status  New    Target Date  02/06/20      PT LONG TERM GOAL #2   Title  Vestibular goal if appropriate    Baseline  TBD    Time  8    Period  Weeks    Status  New    Target Date  02/06/20      PT LONG TERM GOAL #3   Title  Pt will demonstrate 12-15 deg increase in cervical ROM    Baseline  30/30,  30/35, 40/50    Time  8    Period  Weeks    Status  New    Target Date  02/06/20      PT LONG TERM GOAL #4   Title  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  Pt tolerated exercises well with no increase in neck or occipital pain.  Will continue to address with trigger point dry needling if pt gives consent and will perform vestibular evaluation.    Personal Factors and Comorbidities  Comorbidity 3+;Fitness;Profession;Social Background    Comorbidities  Fall from  roof 15 years ago with L facial fracture and +LOC; allergies, anxiety, GERD, kidney stones, HLD, panic attacks, Vit D deficiency    Examination-Activity Limitations  Carry;Lift    Examination-Participation Restrictions  Cleaning;Yard Work    Stability/Clinical Decision Making  Stable/Uncomplicated    Rehab Potential  Good    PT Frequency  2x / week   2x/week x 4, 1x/week x 4 if pt is making good progress   PT Duration  8 weeks    PT Treatment/Interventions  ADLs/Self Care Home Management;Cryotherapy;Moist Heat;Functional mobility training;Therapeutic activities;Therapeutic exercise;Balance training;Neuromuscular re-education;Patient/family education;Manual techniques;Passive range of motion;Dry needling;Vestibular;Taping    PT Next Visit Plan  Dry needling questions-try TDN? vestibular evaluation; check STG; Gentle mobs to neck and thoracic spine - she is nervous about manipulation due to prior experiences.    PT Home Exercise Plan  [id number]    Consulted and Agree with Plan of Care  Patient       Patient will benefit from skilled therapeutic intervention in order to improve the following deficits and impairments:  Decreased range of motion,  Dizziness, Postural dysfunction, Pain  Visit Diagnosis: Cervicalgia     Problem List Patient Active Problem List   Diagnosis Date Noted  . Occipital neuralgia of right side 11/14/2019  . GERD (gastroesophageal reflux disease) 05/25/2012    Jasmin Owens, PT, DPT 01/07/20    6:23 PM    [facility] [address] [place], [place], [address] Phone: [phone]   Fax:  [phone]  Name: Jasmin Owens MRN: [id number] Date of Birth: 08/25/1951

## 2020-01-11 ENCOUNTER — Encounter: Payer: Self-pay | Admitting: Physical Therapy

## 2020-01-11 ENCOUNTER — Ambulatory Visit: Payer: PPO | Admitting: Physical Therapy

## 2020-01-11 ENCOUNTER — Other Ambulatory Visit: Payer: Self-pay

## 2020-01-11 DIAGNOSIS — M542 Cervicalgia: Secondary | ICD-10-CM | POA: Diagnosis not present

## 2020-01-11 DIAGNOSIS — R42 Dizziness and giddiness: Secondary | ICD-10-CM

## 2020-01-12 NOTE — Therapy (Signed)
[facility] [address] [place], [place], [address] Phone: [phone]   Fax:  [phone]  Physical Therapy Treatment  Patient Details  Name: Jasmin Owens MRN: [id number] Date of Birth: [birth year] Referring Provider (PT): Melvenia Beam, MD   Encounter Date: 01/11/2020  PT End of Session - 01/12/20 1015    Visit Number  5    Number of Visits  13    Date for PT Re-Evaluation  02/06/20    Authorization Type  [employer]; $15 copay    Progress Note Due on Visit  10    PT Start Time  [id number]    PT Stop Time  1410    PT Time Calculation (min)  42 min    Equipment Utilized During Treatment  Other (comment)   dry needles   Activity Tolerance  Patient tolerated treatment well    Behavior During Therapy  [facility] for tasks assessed/performed       Past Medical History:  Diagnosis Date  . Allergic rhinitis, seasonal   . Allergies    dust  . Anxiety   . BMI 27.0-27.9,adult   . GERD (gastroesophageal reflux disease)   . History of kidney stones   . Hyperlipidemia LDL goal <100   . Panic attacks   . Vitamin D deficiency     Past Surgical History:  Procedure Laterality Date  . ABDOMINAL HYSTERECTOMY      There were no vitals filed for this visit.  Subjective Assessment - 01/11/20 1330    Subjective  Nothing new to report from last session; sore from exercises but not pain.    Pertinent History  Fall from roof 15 years ago with L facial fracture and +LOC; allergies, anxiety, GERD, kidney stones, HLD, panic attacks, Vit D deficiency    Patient Stated Goals  Is there a way to prevent this from happening again?    Pain Onset  More than a month ago                       [facility] Adult PT Treatment/Exercise - 01/11/20 1406      Exercises   Exercises  Other Exercises    Other Exercises   after TDN to facilitate greater muscular ROM: performed 10 reps posterior shoulder rolls; 10 reps each gentle  head turns and head nods; 2 reps L upper trap stretch      Modalities   Modalities  Moist Heat      Moist Heat Therapy   Number Minutes Moist Heat  15 Minutes    Moist Heat Location  Cervical   at end of session after TDN      Trigger Point Dry Needling - 01/11/20 1401    Consent Given?  Yes    Education Handout Provided  Yes    Muscles Treated Head and Neck  Upper trapezius;Splenius capitus;Semispinalis capitus;Cervical multifidi    Dry Needling Comments  No difficulty initially tolerating dry needling of L upper trap and cervical paraspinals.  When returning to sitting pt reported feeling "weird in the head".  Provided pt with water and guided pt through gentle neck movements and stretches.  Pt denied feeling dizzy or lightheaded.  Assisted pt back to chair and provided pt with hot pack to neck and shoulders.  Allowed pt time to relax in chair with PT checking in at 5 minutes.  Pt was able to return to baseline before leaving clinic    Upper Trapezius  Response  Twitch reponse elicited    Splenius capitus Response  Twitch reponse elicited    Semispinalis capitus Response  Twitch reponse elicited    Cervical multifidi Response  Twitch reponse elicited           PT Education - 01/12/20 1014    Education Details  TDN - what to expect after needling and how to manage side effects; did educate on precautions if needling over lung field - no muscles needled over lung field today.  Pt reporting to therapist that she no longer experiences dizziness and that after her very first episode she was told she may have Meniere's disease.  Educated pt on Meniere's disease and hearing loss - pt does have hearing loss. Discussed other symptoms with patient - pt expressed some sensitivity to noises and light - discussed possibility of vestibular migraines.  Pt states daughter suffers from significant migraines.    Person(s) Educated  Patient    Methods  Explanation    Comprehension  Verbalized  understanding       PT Short Term Goals - 12/08/19 1510      PT SHORT TERM GOAL #1   Title  Pt will participate in full vestibular assessment and will initiate vestibular HEP if indicated    Time  4    Period  Weeks    Status  New    Target Date  01/07/20      PT SHORT TERM GOAL #2   Title  Pt will demonstrate compliance with initial HEP focusing on cervicothoracic and shoulder ROM, strengthening and posture    Time  4    Period  Weeks    Status  New    Target Date  01/07/20      PT SHORT TERM GOAL #3   Title  Pt will demonstrate 8-10 degree increase in cervical flexion/extension, R/L sidebending and R/L rotation    Baseline  30/30, 30/35, 40/50    Time  4    Period  Weeks    Status  New    Target Date  01/07/20        PT Long Term Goals - 12/08/19 1513      PT LONG TERM GOAL #1   Title  Pt will demonstrate independence with final HEP and will report performing intermittent stretches throughout work day to decrease tension    Time  8    Period  Weeks    Status  New    Target Date  02/06/20      PT LONG TERM GOAL #2   Title  Vestibular goal if appropriate    Baseline  TBD    Time  8    Period  Weeks    Status  New    Target Date  02/06/20      PT LONG TERM GOAL #3   Title  Pt will demonstrate 12-15 deg increase in cervical ROM    Baseline  30/30,  30/35, 40/50    Time  8    Period  Weeks    Status  New    Target Date  02/06/20      PT LONG TERM GOAL #4   Title  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  Continued to address increased myofascial pain, tension and decreased ROM of cervical spine with dry needling and  ROM.  Pt required increased time and use of heat at end of session to return to baseline due to mild sympathetic resposne to needling.  Will monitor response to see if pt will benefit from further dry needling.    Personal Factors and Comorbidities  Comorbidity 3+;Fitness;Profession;Social Background    Comorbidities  Fall from roof 15 years ago with L facial fracture and +LOC; allergies, anxiety, GERD, kidney stones, HLD, panic attacks, Vit D deficiency    Examination-Activity Limitations  Carry;Lift    Examination-Participation Restrictions  Cleaning;Yard Work    Stability/Clinical Decision Making  Stable/Uncomplicated    Rehab Potential  Good    PT Frequency  2x / week   2x/week x 4, 1x/week x 4 if pt is making good progress   PT Duration  8 weeks    PT Treatment/Interventions  ADLs/Self Care Home Management;Cryotherapy;Moist Heat;Functional mobility training;Therapeutic activities;Therapeutic exercise;Balance training;Neuromuscular re-education;Patient/family education;Manual techniques;Passive range of motion;Dry needling;Vestibular;Taping    PT Next Visit Plan  How did she feel after TDN? vestibular evaluation; check STG; Gentle mobs to neck and thoracic spine - she is nervous about manipulation due to prior experiences.    PT Home Exercise Plan  [id number]    Consulted and Agree with Plan of Care  Patient       Patient will benefit from skilled therapeutic intervention in order to improve the following deficits and impairments:  Decreased range of motion, Dizziness, Postural dysfunction, Pain  Visit Diagnosis: Cervicalgia  Dizziness and giddiness     Problem List Patient Active Problem List   Diagnosis Date Noted  . Occipital neuralgia of right side 11/14/2019  . GERD (gastroesophageal reflux disease) 05/25/2012    Rico Junker, PT, DPT 01/12/20    10:25 AM    [facility] [address] [place], [place],  [address] Phone: [phone]   Fax:  [phone]  Name: Jasmin Owens MRN: [id number] Date of Birth: [birth year]

## 2020-01-16 ENCOUNTER — Other Ambulatory Visit: Payer: Self-pay

## 2020-01-16 ENCOUNTER — Ambulatory Visit: Payer: PPO | Admitting: Physical Therapy

## 2020-01-16 DIAGNOSIS — M542 Cervicalgia: Secondary | ICD-10-CM | POA: Diagnosis not present

## 2020-01-16 NOTE — Therapy (Signed)
[facility] [address] [place], [place], [address] Phone: [phone]   Fax:  [phone]  Physical Therapy Treatment  Patient Details  Name: Jasmin Owens MRN: [id number] Date of Birth: [birth year] Referring Provider (PT): Melvenia Beam, MD   Encounter Date: 01/16/2020  PT End of Session - 01/16/20 1405    Visit Number  6    Number of Visits  13    Date for PT Re-Evaluation  02/06/20    Authorization Type  [employer]; $15 copay    Progress Note Due on Visit  10    PT Start Time  1320    PT Stop Time  1400    PT Time Calculation (min)  40 min    Activity Tolerance  Patient tolerated treatment well    Behavior During Therapy  [facility] for tasks assessed/performed       Past Medical History:  Diagnosis Date  . Allergic rhinitis, seasonal   . Allergies    dust  . Anxiety   . BMI 27.0-27.9,adult   . GERD (gastroesophageal reflux disease)   . History of kidney stones   . Hyperlipidemia LDL goal <100   . Panic attacks   . Vitamin D deficiency     Past Surgical History:  Procedure Laterality Date  . ABDOMINAL HYSTERECTOMY      There were no vitals filed for this visit.  Subjective Assessment - 01/16/20 1325    Subjective  Was able to let symptoms settle last session before leaving.  Was a little sore that day but felt like she had more ROM and less pain the next day.    Pertinent History  Fall from roof 15 years ago with L facial fracture and +LOC; allergies, anxiety, GERD, kidney stones, HLD, panic attacks, Vit D deficiency    Patient Stated Goals  Is there a way to prevent this from happening again?    Currently in Pain?  No/denies    Pain Onset  More than a month ago         [facility] PT Assessment - 01/16/20 1327      ROM / Strength   AROM / PROM / Strength  AROM      AROM   AROM Assessment Site  Cervical    Cervical Flexion  45    Cervical Extension  50    Cervical - Right Side Bend  30     Cervical - Left Side Bend  35    Cervical - Right Rotation  75    Cervical - Left Rotation  68                   [facility] Adult PT Treatment/Exercise - 01/16/20 1403      Exercises   Exercises  Neck      Neck Exercises: Supine   Neck Retraction  10 reps;5 secs    Neck Retraction Limitations  verbal cues to keep chin slightly tucked and to avoid use of SCM      Manual Therapy   Manual Therapy  Joint mobilization;Manual Traction    Manual therapy comments  to improve neck ROM for lateral sidebending    Joint Mobilization  Supine grade I lateral and inferior glides to R and L upper, middle and lower cervical spine    Manual Traction  x 2 minutes x 2 reps         Access Code: [email or web address] URL: [email or web address] Date: 01/16/2020 Prepared by: Letta Moynahan  Arron Tetrault  Exercises Seated Shoulder Shrug Circles AROM Backward - 1 x daily - 7 x weekly - 1 sets - 10 reps Seated Cervical Sidebending Stretch - 1 x daily - 7 x weekly - 1 sets - 2 reps - 30-60 secs hold Seated Thoracic Lumbar Extension with Pectoralis Stretch - 1 x daily - 7 x weekly - 3 sets - 10 reps Seated Assisted Cervical Rotation with Towel - 1 x daily - 7 x weekly - 1 sets - 10 reps Single Arm Shoulder Extension with Anchored Resistance - 1 x daily - 7 x weekly - 1 sets - 10 reps - 2-3 secs hold Single Arm Row with Trunk Rotation - 1 x daily - 7 x weekly - 2 sets - 10 reps Doorway Pec Stretch at 60 Elevation - 1 x daily - 7 x weekly - 2 sets - 20 second hold Supine Cervical Retraction with Towel - 1 x daily - 7 x weekly - 2 sets - 10 reps      PT Education - 01/16/20 1404    Education Details  progress made, updated HEP    Person(s) Educated  Patient    Methods  Explanation;Demonstration;Handout    Comprehension  Verbalized understanding;Returned demonstration       PT Short Term Goals - 01/16/20 1327      PT SHORT TERM GOAL #1   Title  Pt will participate in full vestibular assessment and will  initiate vestibular HEP if indicated    Baseline  Assessment not needed; dizziness has fully resolved    Time  4    Period  Weeks    Status  Deferred    Target Date  01/07/20      PT SHORT TERM GOAL #2   Title  Pt will demonstrate compliance with initial HEP focusing on cervicothoracic and shoulder ROM, strengthening and posture    Time  4    Period  Weeks    Status  Achieved    Target Date  01/07/20      PT SHORT TERM GOAL #3   Title  Pt will demonstrate 8-10 degree increase in cervical flexion/extension, R/L sidebending and R/L rotation    Baseline  30/30, 30/35, 40/50 > goal met for flex/ext and rotation.  Not met for sidebending    Time  4    Period  Weeks    Status  Partially Met    Target Date  01/07/20        PT Long Term Goals - 12/08/19 1513      PT LONG TERM GOAL #1   Title  Pt will demonstrate independence with final HEP and will report performing intermittent stretches throughout work day to decrease tension    Time  8    Period  Weeks    Status  New    Target Date  02/06/20      PT LONG TERM GOAL #2   Title  Vestibular goal if appropriate    Baseline  TBD    Time  8    Period  Weeks    Status  New    Target Date  02/06/20      PT LONG TERM GOAL #3   Title  Pt will demonstrate 12-15 deg increase in cervical ROM    Baseline  30/30,  30/35, 40/50    Time  8    Period  Weeks    Status  New    Target Date  02/06/20  PT LONG TERM GOAL #4   Title  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  Pt has met HEP goal, demonstrates significant improvement in all cervical ROM except sidebending; vestibular goal deferred as pt is still not reporting any dizziness.  Rest of session focused on deep neck flexor  strengthening and manual therapy for increased lateral flexion.  Pt reports significant improvement in pain and ROM.  LTG updated to reflect progress.  Will continue to progress towards LTG.    Personal Factors and Comorbidities  Comorbidity 3+;Fitness;Profession;Social Background    Comorbidities  Fall from roof 15 years ago with L facial fracture and +LOC; allergies, anxiety, GERD, kidney stones, HLD, panic attacks, Vit D deficiency    Examination-Activity Limitations  Carry;Lift    Examination-Participation Restrictions  Cleaning;Yard Work    Stability/Clinical Decision Making  Stable/Uncomplicated    Rehab Potential  Good    PT Frequency  2x / week   2x/week x 4, 1x/week x 4 if pt is making good progress   PT Duration  8 weeks    PT Treatment/Interventions  ADLs/Self Care Home Management;Cryotherapy;Moist Heat;Functional mobility training;Therapeutic activities;Therapeutic exercise;Balance training;Neuromuscular re-education;Patient/family education;Manual techniques;Passive range of motion;Dry needling;Vestibular;Taping    PT Next Visit Plan  Work on neck flexor strengthening and lifting heavier objects to simulate lifting grandchildren.  Gentle mobs to neck and thoracic spine - she is nervous about manipulation due to prior experiences.    PT Home Exercise Plan  [email or web address]    Consulted and Agree with Plan of Care  Patient       Patient will benefit from skilled therapeutic intervention in order to improve the following deficits and impairments:  Decreased range of motion, Dizziness, Postural dysfunction, Pain  Visit Diagnosis: Cervicalgia     Problem List Patient Active Problem List   Diagnosis Date Noted  . Occipital neuralgia of right side 11/14/2019  . GERD (gastroesophageal reflux disease) 05/25/2012    Rico Junker, PT, DPT 01/16/20    5:14 PM    [facility] [address] [place], [place], [address] Phone:  [phone]   Fax:  [phone]  Name: Jasmin Owens MRN: [id number] Date of Birth: Sep 15, 1951

## 2020-01-16 NOTE — Patient Instructions (Addendum)
Access Code: [email or web address] URL: [email or web address] Date: 01/16/2020 Prepared by: Misty Stanley  Exercises Seated Shoulder Shrug Circles AROM Backward - 1 x daily - 7 x weekly - 1 sets - 10 reps Seated Cervical Sidebending Stretch - 1 x daily - 7 x weekly - 1 sets - 2 reps - 30-60 secs hold Seated Thoracic Lumbar Extension with Pectoralis Stretch - 1 x daily - 7 x weekly - 3 sets - 10 reps Seated Assisted Cervical Rotation with Towel - 1 x daily - 7 x weekly - 1 sets - 10 reps Single Arm Shoulder Extension with Anchored Resistance - 1 x daily - 7 x weekly - 1 sets - 10 reps - 2-3 secs hold Single Arm Row with Trunk Rotation - 1 x daily - 7 x weekly - 2 sets - 10 reps Doorway Pec Stretch at 60 Elevation - 1 x daily - 7 x weekly - 2 sets - 20 second hold Supine Cervical Retraction with Towel - 1 x daily - 7 x weekly - 2 sets - 10 reps

## 2020-01-18 ENCOUNTER — Ambulatory Visit: Payer: PPO | Admitting: Physical Therapy

## 2020-01-23 ENCOUNTER — Encounter: Payer: Self-pay | Admitting: Physical Therapy

## 2020-01-23 ENCOUNTER — Ambulatory Visit: Payer: PPO | Attending: Neurology | Admitting: Physical Therapy

## 2020-01-23 ENCOUNTER — Other Ambulatory Visit: Payer: Self-pay

## 2020-01-23 DIAGNOSIS — M542 Cervicalgia: Secondary | ICD-10-CM

## 2020-01-23 NOTE — Patient Instructions (Signed)
Access Code: [email or web address] URL: [email or web address] Date: 01/16/2020 Prepared by: Misty Stanley  Exercises Seated Shoulder Shrug Circles AROM Backward - 1 x daily - 7 x weekly - 1 sets - 10 reps Seated Cervical Sidebending Stretch - 1 x daily - 7 x weekly - 1 sets - 2 reps - 30-60 secs hold Seated Thoracic Lumbar Extension with Pectoralis Stretch - 1 x daily - 7 x weekly - 3 sets - 10 reps Seated Assisted Cervical Rotation with Towel - 1 x daily - 7 x weekly - 1 sets - 10 reps Single Arm Shoulder Extension with Anchored Resistance - 1 x daily - 7 x weekly - 1 sets - 10 reps - 2-3 secs hold Single Arm Row with Trunk Rotation - 1 x daily - 7 x weekly - 2 sets - 10 reps Doorway Pec Stretch at 60 Elevation - 1 x daily - 7 x weekly - 2 sets - 20 second hold Supine Cervical Retraction with Towel - 1 x daily - 7 x weekly - 2 sets - 10 reps Squat with Chair Touch - 1 x daily - 7 x weekly - 2 sets - 10 reps

## 2020-01-23 NOTE — Therapy (Signed)
[facility] [address] [place], [place], [address] Phone: [phone]   Fax:  [phone]  Physical Therapy Treatment  Patient Details  Name: Jasmin Owens MRN: [id number] Date of Birth: October 19, 1950 Referring Provider (PT): Melvenia Beam, MD   Encounter Date: 01/23/2020  PT End of Session - 01/23/20 1630    Visit Number  7    Number of Visits  13    Date for PT Re-Evaluation  02/06/20    Authorization Type  [employer]; $15 copay    Progress Note Due on Visit  10    PT Start Time  1320    PT Stop Time  1404    PT Time Calculation (min)  44 min    Activity Tolerance  Patient tolerated treatment well    Behavior During Therapy  [facility] for tasks assessed/performed       Past Medical History:  Diagnosis Date  . Allergic rhinitis, seasonal   . Allergies    dust  . Anxiety   . BMI 27.0-27.9,adult   . GERD (gastroesophageal reflux disease)   . History of kidney stones   . Hyperlipidemia LDL goal <100   . Panic attacks   . Vitamin D deficiency     Past Surgical History:  Procedure Laterality Date  . ABDOMINAL HYSTERECTOMY      There were no vitals filed for this visit.  Subjective Assessment - 01/23/20 1329    Subjective  Had alot of extended family in town this weekend.  Wasn't able to do as many exercises but is not having increased pain, tension or dizziness.  Picked up granddaughter a lot this weekend and did well with it.    Pertinent History  Fall from roof 15 years ago with L facial fracture and +LOC; allergies, anxiety, GERD, kidney stones, HLD, panic attacks, Vit D deficiency    Patient Stated Goals  Is there a way to prevent this from happening again?    Currently in Pain?  No/denies    Pain Onset  More than a month ago         [facility] PT Assessment - 01/23/20 1349      Observation/Other Assessments   Focus on Therapeutic Outcomes (FOTO)   Not assessed      ROM / Strength   AROM /  PROM / Strength  AROM      AROM   AROM Assessment Site  Cervical    Cervical Flexion  45    Cervical Extension  50    Cervical - Right Side Bend  35    Cervical - Left Side Bend  35    Cervical - Right Rotation  75    Cervical - Left Rotation  68            [facility] Adult PT Treatment/Exercise - 01/23/20 1347      Therapeutic Activites    Therapeutic Activities  Lifting    Lifting  Focused on safe lifting technique lifting 20lb in crate from mat <> floor focusing on widening stance with small squat to lift from mat and keeping weight close to body and then performing full squat to lift from ground.  Discussed how to replicate at home when lifting heavy items, dog or granddaughter.  Also discussed how to lift and pivot feet to turn to L and R keeping weight close to body.      Exercises   Exercises  Knee/Hip      Knee/Hip  Exercises: Standing   Functional Squat  1 set;10 reps    Functional Squat Limitations  cued to hold 5lb weight in hands when performing squats at home; in therapy clinic performed functional squats with lifting crate with 20lb             PT Education - 01/23/20 1629    Education Details  final HEP, progress made and goals met.  Will cancel 2 visits and follow up with pt on 5/26 to see if she has been able to maintain gains with HEP or has regressed.  If pt has maintained, will proceed with D/C    Person(s) Educated  Patient    Methods  Explanation;Demonstration;Handout    Comprehension  Verbalized understanding;Returned demonstration      Access Code: [email or web address] URL: [email or web address] Date: 01/16/2020 Prepared by: Misty Stanley  Exercises Seated Shoulder Shrug Circles AROM Backward - 1 x daily - 7 x weekly - 1 sets - 10 reps Seated Cervical Sidebending Stretch - 1 x daily - 7 x weekly - 1 sets - 2 reps - 30-60 secs hold Seated Thoracic Lumbar Extension with Pectoralis Stretch - 1 x daily - 7 x weekly - 3 sets - 10 reps Seated  Assisted Cervical Rotation with Towel - 1 x daily - 7 x weekly - 1 sets - 10 reps Single Arm Shoulder Extension with Anchored Resistance - 1 x daily - 7 x weekly - 1 sets - 10 reps - 2-3 secs hold Single Arm Row with Trunk Rotation - 1 x daily - 7 x weekly - 2 sets - 10 reps Doorway Pec Stretch at 60 Elevation - 1 x daily - 7 x weekly - 2 sets - 20 second hold Supine Cervical Retraction with Towel - 1 x daily - 7 x weekly - 2 sets - 10 reps Squat with Chair Touch - 1 x daily - 7 x weekly - 2 sets - 10 reps   PT Short Term Goals - 01/16/20 1327      PT SHORT TERM GOAL #1   Title  Pt will participate in full vestibular assessment and will initiate vestibular HEP if indicated    Baseline  Assessment not needed; dizziness has fully resolved    Time  4    Period  Weeks    Status  Deferred    Target Date  01/07/20      PT SHORT TERM GOAL #2   Title  Pt will demonstrate compliance with initial HEP focusing on cervicothoracic and shoulder ROM, strengthening and posture    Time  4    Period  Weeks    Status  Achieved    Target Date  01/07/20      PT SHORT TERM GOAL #3   Title  Pt will demonstrate 8-10 degree increase in cervical flexion/extension, R/L sidebending and R/L rotation    Baseline  30/30, 30/35, 40/50 > goal met for flex/ext and rotation.  Not met for sidebending    Time  4    Period  Weeks    Status  Partially Met    Target Date  01/07/20        PT Long Term Goals - 01/23/20 1354      PT LONG TERM GOAL #1   Title  Pt will demonstrate independence with final HEP and will report performing intermittent stretches throughout work day to decrease tension    Time  8    Period  Weeks    Status  Achieved  PT LONG TERM GOAL #2   Title  Vestibular goal if appropriate    Baseline  TBD    Time  8    Period  Weeks    Status  Deferred      PT LONG TERM GOAL #3   Title  Pt will demonstrate 12-15 deg increase in cervical ROM    Baseline  30/30,  30/35, 40/50 > improved by  15 deg, 20 deg, 35 deg and 18 deg.  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  She also reports decreased symptoms when holding her granddaughter - provided education today regarding safer technique for lifting to avoid back and neck strain.  Pt did feel an improvement in back strain when using squat vs. bending forwards. Squats added to HEP.  Due to fast progress, next two visits cancelled to allow pt to self manage at home; if pt has maintained progress will proceed with D/C.  Pt agreeable to POC.    Personal Factors and Comorbidities  Comorbidity 3+;Fitness;Profession;Social Background    Comorbidities  Fall from roof 15 years ago with L facial fracture and +LOC; allergies, anxiety, GERD, kidney stones, HLD, panic attacks, Vit D deficiency    Examination-Activity Limitations  Carry;Lift    Examination-Participation Restrictions  Cleaning;Yard Work    Stability/Clinical Decision Making  Stable/Uncomplicated    Rehab Potential  Good    PT Frequency  2x / week   2x/week x 4, 1x/week x 4 if pt is making good progress   PT Duration  8 weeks    PT Treatment/Interventions  ADLs/Self Care Home Management;Cryotherapy;Moist Heat;Functional mobility training;Therapeutic activities;Therapeutic exercise;Balance training;Neuromuscular re-education;Patient/family education;Manual techniques;Passive range of motion;Dry needling;Vestibular;Taping    PT Next Visit Plan  Did she maintain gains?  D/C?    PT Home Exercise Plan  [email or web address]    Consulted and Agree with  Plan of Care  Patient       Patient will benefit from skilled therapeutic intervention in order to improve the following deficits and impairments:  Decreased range of motion, Dizziness, Postural dysfunction, Pain  Visit Diagnosis: Cervicalgia     Problem List Patient Active Problem List   Diagnosis Date Noted  . Occipital neuralgia of right side 11/14/2019  . GERD (gastroesophageal reflux disease) 05/25/2012    Rico Junker, PT, DPT 01/23/20    4:40 PM    [facility] [address] [place], [place], [address] Phone: [phone]   Fax:  [phone]  Name: Jasmin Owens MRN: [id number] Date of Birth: November 20, 1950

## 2020-01-30 ENCOUNTER — Ambulatory Visit: Payer: PPO | Admitting: Physical Therapy

## 2020-02-06 ENCOUNTER — Ambulatory Visit: Payer: PPO | Admitting: Physical Therapy

## 2020-02-12 DIAGNOSIS — Z139 Encounter for screening, unspecified: Secondary | ICD-10-CM | POA: Diagnosis not present

## 2020-02-12 DIAGNOSIS — E785 Hyperlipidemia, unspecified: Secondary | ICD-10-CM | POA: Diagnosis not present

## 2020-02-12 DIAGNOSIS — F410 Panic disorder [episodic paroxysmal anxiety] without agoraphobia: Secondary | ICD-10-CM | POA: Diagnosis not present

## 2020-02-12 DIAGNOSIS — E559 Vitamin D deficiency, unspecified: Secondary | ICD-10-CM | POA: Diagnosis not present

## 2020-02-13 ENCOUNTER — Ambulatory Visit: Payer: PPO | Admitting: Physical Therapy

## 2020-02-13 ENCOUNTER — Encounter: Payer: Self-pay | Admitting: Physical Therapy

## 2020-02-13 NOTE — Therapy (Signed)
[facility] [address] [place], [place], [address] Phone: [phone]   Fax:  [phone]  Patient Details  Name: Jasmin Owens MRN: [id number] Date of Birth: 06-Dec-1950 Referring Provider:  No ref. provider found  Encounter Date: 02/13/2020  PHYSICAL THERAPY DISCHARGE SUMMARY  Visits from Start of Care: 7  Current functional level related to goals / functional outcomes: Pt seen last on 01/23/20 with assessment of LTG; pt then placed on hold for 2-3 weeks to see if she was able to maintain gains with HEP.  Pt contacted therapist today to report that she was continuing to do well and did not have a return of symptoms.  Pt did not need final therapy session.  Pt D/C from therapy today.   Remaining deficits: None   Education / Equipment: HEP  Plan: Patient agrees to discharge.  Patient goals were met. Patient is being discharged due to meeting the stated rehab goals.  ?????     Rico Junker, PT, DPT 02/13/20    3:57 PM    [facility] [address] [place], [place], [address] Phone: [phone]   Fax:  [phone]

## 2020-02-25 DIAGNOSIS — J029 Acute pharyngitis, unspecified: Secondary | ICD-10-CM | POA: Diagnosis not present

## 2020-07-22 DIAGNOSIS — N200 Calculus of kidney: Secondary | ICD-10-CM | POA: Diagnosis not present

## 2020-07-22 DIAGNOSIS — Z23 Encounter for immunization: Secondary | ICD-10-CM | POA: Diagnosis not present

## 2020-07-23 DIAGNOSIS — N200 Calculus of kidney: Secondary | ICD-10-CM | POA: Diagnosis not present

## 2020-07-23 DIAGNOSIS — Z90710 Acquired absence of both cervix and uterus: Secondary | ICD-10-CM | POA: Diagnosis not present

## 2020-08-06 DIAGNOSIS — E785 Hyperlipidemia, unspecified: Secondary | ICD-10-CM | POA: Diagnosis not present

## 2020-08-06 DIAGNOSIS — Z9181 History of falling: Secondary | ICD-10-CM | POA: Diagnosis not present

## 2020-08-06 DIAGNOSIS — Z0000 Encounter for general adult medical examination without abnormal findings: Secondary | ICD-10-CM | POA: Diagnosis not present

## 2020-08-06 DIAGNOSIS — Z1331 Encounter for screening for depression: Secondary | ICD-10-CM | POA: Diagnosis not present

## 2020-08-19 DIAGNOSIS — E559 Vitamin D deficiency, unspecified: Secondary | ICD-10-CM | POA: Diagnosis not present

## 2020-08-19 DIAGNOSIS — N200 Calculus of kidney: Secondary | ICD-10-CM | POA: Diagnosis not present

## 2020-08-19 DIAGNOSIS — F410 Panic disorder [episodic paroxysmal anxiety] without agoraphobia: Secondary | ICD-10-CM | POA: Diagnosis not present

## 2020-08-19 DIAGNOSIS — E785 Hyperlipidemia, unspecified: Secondary | ICD-10-CM | POA: Diagnosis not present

## 2020-08-20 DIAGNOSIS — Z1231 Encounter for screening mammogram for malignant neoplasm of breast: Secondary | ICD-10-CM | POA: Diagnosis not present

## 2020-08-20 DIAGNOSIS — Z6828 Body mass index (BMI) 28.0-28.9, adult: Secondary | ICD-10-CM | POA: Diagnosis not present

## 2020-08-20 DIAGNOSIS — N952 Postmenopausal atrophic vaginitis: Secondary | ICD-10-CM | POA: Diagnosis not present

## 2020-08-20 DIAGNOSIS — Z01419 Encounter for gynecological examination (general) (routine) without abnormal findings: Secondary | ICD-10-CM | POA: Diagnosis not present

## 2020-11-03 DIAGNOSIS — N3281 Overactive bladder: Secondary | ICD-10-CM | POA: Diagnosis not present

## 2020-12-05 DIAGNOSIS — N3281 Overactive bladder: Secondary | ICD-10-CM | POA: Diagnosis not present

## 2021-01-14 DIAGNOSIS — J069 Acute upper respiratory infection, unspecified: Secondary | ICD-10-CM | POA: Diagnosis not present

## 2021-02-18 DIAGNOSIS — F410 Panic disorder [episodic paroxysmal anxiety] without agoraphobia: Secondary | ICD-10-CM | POA: Diagnosis not present

## 2021-02-18 DIAGNOSIS — E785 Hyperlipidemia, unspecified: Secondary | ICD-10-CM | POA: Diagnosis not present

## 2021-02-18 DIAGNOSIS — Z1231 Encounter for screening mammogram for malignant neoplasm of breast: Secondary | ICD-10-CM | POA: Diagnosis not present

## 2021-02-18 DIAGNOSIS — E559 Vitamin D deficiency, unspecified: Secondary | ICD-10-CM | POA: Diagnosis not present

## 2021-02-18 DIAGNOSIS — Z6828 Body mass index (BMI) 28.0-28.9, adult: Secondary | ICD-10-CM | POA: Diagnosis not present

## 2021-02-18 DIAGNOSIS — Z139 Encounter for screening, unspecified: Secondary | ICD-10-CM | POA: Diagnosis not present

## 2021-03-06 DIAGNOSIS — N3281 Overactive bladder: Secondary | ICD-10-CM | POA: Diagnosis not present

## 2021-03-06 DIAGNOSIS — N200 Calculus of kidney: Secondary | ICD-10-CM | POA: Diagnosis not present

## 2021-03-16 DIAGNOSIS — R319 Hematuria, unspecified: Secondary | ICD-10-CM | POA: Diagnosis not present

## 2021-08-06 DIAGNOSIS — Z23 Encounter for immunization: Secondary | ICD-10-CM | POA: Diagnosis not present

## 2021-08-20 DIAGNOSIS — Z1331 Encounter for screening for depression: Secondary | ICD-10-CM | POA: Diagnosis not present

## 2021-08-20 DIAGNOSIS — Z9181 History of falling: Secondary | ICD-10-CM | POA: Diagnosis not present

## 2021-08-20 DIAGNOSIS — Z0000 Encounter for general adult medical examination without abnormal findings: Secondary | ICD-10-CM | POA: Diagnosis not present

## 2021-08-20 DIAGNOSIS — E559 Vitamin D deficiency, unspecified: Secondary | ICD-10-CM | POA: Diagnosis not present

## 2021-08-20 DIAGNOSIS — Z79899 Other long term (current) drug therapy: Secondary | ICD-10-CM | POA: Diagnosis not present

## 2021-08-20 DIAGNOSIS — F410 Panic disorder [episodic paroxysmal anxiety] without agoraphobia: Secondary | ICD-10-CM | POA: Diagnosis not present

## 2021-08-20 DIAGNOSIS — E785 Hyperlipidemia, unspecified: Secondary | ICD-10-CM | POA: Diagnosis not present

## 2021-08-26 DIAGNOSIS — Z780 Asymptomatic menopausal state: Secondary | ICD-10-CM | POA: Diagnosis not present

## 2021-08-26 DIAGNOSIS — N952 Postmenopausal atrophic vaginitis: Secondary | ICD-10-CM | POA: Diagnosis not present

## 2021-08-26 DIAGNOSIS — Z1231 Encounter for screening mammogram for malignant neoplasm of breast: Secondary | ICD-10-CM | POA: Diagnosis not present

## 2021-08-26 DIAGNOSIS — Z01419 Encounter for gynecological examination (general) (routine) without abnormal findings: Secondary | ICD-10-CM | POA: Diagnosis not present

## 2021-09-04 DIAGNOSIS — R002 Palpitations: Secondary | ICD-10-CM | POA: Diagnosis not present

## 2021-09-04 DIAGNOSIS — F419 Anxiety disorder, unspecified: Secondary | ICD-10-CM | POA: Diagnosis not present

## 2021-09-04 DIAGNOSIS — H8103 Meniere's disease, bilateral: Secondary | ICD-10-CM | POA: Diagnosis not present

## 2021-09-25 DIAGNOSIS — N3281 Overactive bladder: Secondary | ICD-10-CM | POA: Diagnosis not present

## 2021-09-25 DIAGNOSIS — N200 Calculus of kidney: Secondary | ICD-10-CM | POA: Diagnosis not present

## 2022-02-18 DIAGNOSIS — E785 Hyperlipidemia, unspecified: Secondary | ICD-10-CM | POA: Diagnosis not present

## 2022-02-18 DIAGNOSIS — E559 Vitamin D deficiency, unspecified: Secondary | ICD-10-CM | POA: Diagnosis not present

## 2022-02-18 DIAGNOSIS — F410 Panic disorder [episodic paroxysmal anxiety] without agoraphobia: Secondary | ICD-10-CM | POA: Diagnosis not present

## 2022-03-29 DIAGNOSIS — F419 Anxiety disorder, unspecified: Secondary | ICD-10-CM | POA: Diagnosis not present

## 2022-03-29 DIAGNOSIS — F410 Panic disorder [episodic paroxysmal anxiety] without agoraphobia: Secondary | ICD-10-CM | POA: Diagnosis not present

## 2022-04-22 DIAGNOSIS — N3281 Overactive bladder: Secondary | ICD-10-CM | POA: Diagnosis not present

## 2022-04-22 DIAGNOSIS — N200 Calculus of kidney: Secondary | ICD-10-CM | POA: Diagnosis not present

## 2022-08-20 DIAGNOSIS — F410 Panic disorder [episodic paroxysmal anxiety] without agoraphobia: Secondary | ICD-10-CM | POA: Diagnosis not present

## 2022-08-20 DIAGNOSIS — E785 Hyperlipidemia, unspecified: Secondary | ICD-10-CM | POA: Diagnosis not present

## 2022-08-20 DIAGNOSIS — Z23 Encounter for immunization: Secondary | ICD-10-CM | POA: Diagnosis not present

## 2022-08-20 DIAGNOSIS — F419 Anxiety disorder, unspecified: Secondary | ICD-10-CM | POA: Diagnosis not present

## 2022-08-20 DIAGNOSIS — E559 Vitamin D deficiency, unspecified: Secondary | ICD-10-CM | POA: Diagnosis not present

## 2022-09-21 DIAGNOSIS — F419 Anxiety disorder, unspecified: Secondary | ICD-10-CM | POA: Diagnosis not present

## 2022-09-21 DIAGNOSIS — G2581 Restless legs syndrome: Secondary | ICD-10-CM | POA: Diagnosis not present

## 2022-10-26 DIAGNOSIS — F419 Anxiety disorder, unspecified: Secondary | ICD-10-CM | POA: Diagnosis not present

## 2022-11-17 DIAGNOSIS — Z01419 Encounter for gynecological examination (general) (routine) without abnormal findings: Secondary | ICD-10-CM | POA: Diagnosis not present

## 2022-11-17 DIAGNOSIS — Z780 Asymptomatic menopausal state: Secondary | ICD-10-CM | POA: Diagnosis not present

## 2022-11-17 DIAGNOSIS — Z1231 Encounter for screening mammogram for malignant neoplasm of breast: Secondary | ICD-10-CM | POA: Diagnosis not present

## 2022-11-18 DIAGNOSIS — F419 Anxiety disorder, unspecified: Secondary | ICD-10-CM | POA: Diagnosis not present

## 2022-11-18 DIAGNOSIS — F410 Panic disorder [episodic paroxysmal anxiety] without agoraphobia: Secondary | ICD-10-CM | POA: Diagnosis not present

## 2022-11-18 DIAGNOSIS — R030 Elevated blood-pressure reading, without diagnosis of hypertension: Secondary | ICD-10-CM | POA: Diagnosis not present

## 2022-11-24 ENCOUNTER — Other Ambulatory Visit: Payer: Self-pay | Admitting: Gynecology

## 2022-11-24 DIAGNOSIS — R928 Other abnormal and inconclusive findings on diagnostic imaging of breast: Secondary | ICD-10-CM

## 2022-12-09 ENCOUNTER — Ambulatory Visit
Admission: RE | Admit: 2022-12-09 | Discharge: 2022-12-09 | Disposition: A | Discharge status: Home / Self Care | Payer: PPO | Source: Ambulatory Visit | Attending: Gynecology | Admitting: Gynecology

## 2022-12-09 ENCOUNTER — Ambulatory Visit: Payer: PPO

## 2022-12-09 DIAGNOSIS — R928 Other abnormal and inconclusive findings on diagnostic imaging of breast: Secondary | ICD-10-CM | POA: Diagnosis not present

## 2022-12-16 DIAGNOSIS — I10 Essential (primary) hypertension: Secondary | ICD-10-CM | POA: Diagnosis not present

## 2022-12-16 DIAGNOSIS — F419 Anxiety disorder, unspecified: Secondary | ICD-10-CM | POA: Diagnosis not present

## 2023-01-03 DIAGNOSIS — F419 Anxiety disorder, unspecified: Secondary | ICD-10-CM | POA: Diagnosis not present

## 2023-01-03 DIAGNOSIS — I10 Essential (primary) hypertension: Secondary | ICD-10-CM | POA: Diagnosis not present

## 2023-01-05 DIAGNOSIS — J209 Acute bronchitis, unspecified: Secondary | ICD-10-CM | POA: Diagnosis not present

## 2023-01-05 DIAGNOSIS — J0100 Acute maxillary sinusitis, unspecified: Secondary | ICD-10-CM | POA: Diagnosis not present

## 2023-01-24 DIAGNOSIS — I10 Essential (primary) hypertension: Secondary | ICD-10-CM | POA: Diagnosis not present

## 2023-02-21 DIAGNOSIS — E559 Vitamin D deficiency, unspecified: Secondary | ICD-10-CM | POA: Diagnosis not present

## 2023-02-21 DIAGNOSIS — F410 Panic disorder [episodic paroxysmal anxiety] without agoraphobia: Secondary | ICD-10-CM | POA: Diagnosis not present

## 2023-02-21 DIAGNOSIS — E785 Hyperlipidemia, unspecified: Secondary | ICD-10-CM | POA: Diagnosis not present

## 2023-02-21 DIAGNOSIS — I10 Essential (primary) hypertension: Secondary | ICD-10-CM | POA: Diagnosis not present

## 2023-02-21 DIAGNOSIS — F419 Anxiety disorder, unspecified: Secondary | ICD-10-CM | POA: Diagnosis not present

## 2023-03-02 DIAGNOSIS — T783XXA Angioneurotic edema, initial encounter: Secondary | ICD-10-CM | POA: Diagnosis not present

## 2023-03-02 DIAGNOSIS — I10 Essential (primary) hypertension: Secondary | ICD-10-CM | POA: Diagnosis not present

## 2023-03-29 DIAGNOSIS — I10 Essential (primary) hypertension: Secondary | ICD-10-CM | POA: Diagnosis not present

## 2023-03-29 DIAGNOSIS — R449 Unspecified symptoms and signs involving general sensations and perceptions: Secondary | ICD-10-CM | POA: Diagnosis not present

## 2023-03-29 DIAGNOSIS — T783XXA Angioneurotic edema, initial encounter: Secondary | ICD-10-CM | POA: Diagnosis not present

## 2023-06-13 DIAGNOSIS — R0989 Other specified symptoms and signs involving the circulatory and respiratory systems: Secondary | ICD-10-CM | POA: Diagnosis not present

## 2023-06-23 DIAGNOSIS — N3281 Overactive bladder: Secondary | ICD-10-CM | POA: Diagnosis not present

## 2023-07-27 DIAGNOSIS — Z01818 Encounter for other preprocedural examination: Secondary | ICD-10-CM | POA: Diagnosis not present

## 2023-07-27 DIAGNOSIS — H0279 Other degenerative disorders of eyelid and periocular area: Secondary | ICD-10-CM | POA: Diagnosis not present

## 2023-07-27 DIAGNOSIS — D485 Neoplasm of uncertain behavior of skin: Secondary | ICD-10-CM | POA: Diagnosis not present

## 2023-08-08 DIAGNOSIS — K140 Glossitis: Secondary | ICD-10-CM | POA: Diagnosis not present

## 2023-08-08 DIAGNOSIS — R0989 Other specified symptoms and signs involving the circulatory and respiratory systems: Secondary | ICD-10-CM | POA: Diagnosis not present

## 2023-08-23 DIAGNOSIS — E559 Vitamin D deficiency, unspecified: Secondary | ICD-10-CM | POA: Diagnosis not present

## 2023-08-23 DIAGNOSIS — F410 Panic disorder [episodic paroxysmal anxiety] without agoraphobia: Secondary | ICD-10-CM | POA: Diagnosis not present

## 2023-08-23 DIAGNOSIS — F419 Anxiety disorder, unspecified: Secondary | ICD-10-CM | POA: Diagnosis not present

## 2023-08-23 DIAGNOSIS — E785 Hyperlipidemia, unspecified: Secondary | ICD-10-CM | POA: Diagnosis not present

## 2023-08-23 DIAGNOSIS — R0982 Postnasal drip: Secondary | ICD-10-CM | POA: Diagnosis not present

## 2023-08-23 DIAGNOSIS — I10 Essential (primary) hypertension: Secondary | ICD-10-CM | POA: Diagnosis not present

## 2023-08-23 DIAGNOSIS — Z23 Encounter for immunization: Secondary | ICD-10-CM | POA: Diagnosis not present

## 2023-10-27 DIAGNOSIS — R0989 Other specified symptoms and signs involving the circulatory and respiratory systems: Secondary | ICD-10-CM | POA: Diagnosis not present

## 2023-11-29 DIAGNOSIS — D22112 Melanocytic nevi of right lower eyelid, including canthus: Secondary | ICD-10-CM | POA: Diagnosis not present

## 2023-11-29 DIAGNOSIS — D485 Neoplasm of uncertain behavior of skin: Secondary | ICD-10-CM | POA: Diagnosis not present

## 2023-11-29 DIAGNOSIS — L918 Other hypertrophic disorders of the skin: Secondary | ICD-10-CM | POA: Diagnosis not present

## 2023-11-29 DIAGNOSIS — H04561 Stenosis of right lacrimal punctum: Secondary | ICD-10-CM | POA: Diagnosis not present

## 2023-11-29 DIAGNOSIS — L821 Other seborrheic keratosis: Secondary | ICD-10-CM | POA: Diagnosis not present

## 2023-11-29 DIAGNOSIS — H0279 Other degenerative disorders of eyelid and periocular area: Secondary | ICD-10-CM | POA: Diagnosis not present

## 2024-02-15 DIAGNOSIS — Z780 Asymptomatic menopausal state: Secondary | ICD-10-CM | POA: Diagnosis not present

## 2024-02-15 DIAGNOSIS — N952 Postmenopausal atrophic vaginitis: Secondary | ICD-10-CM | POA: Diagnosis not present

## 2024-02-15 DIAGNOSIS — Z1231 Encounter for screening mammogram for malignant neoplasm of breast: Secondary | ICD-10-CM | POA: Diagnosis not present

## 2024-02-15 DIAGNOSIS — Z01419 Encounter for gynecological examination (general) (routine) without abnormal findings: Secondary | ICD-10-CM | POA: Diagnosis not present

## 2024-02-27 DIAGNOSIS — F410 Panic disorder [episodic paroxysmal anxiety] without agoraphobia: Secondary | ICD-10-CM | POA: Diagnosis not present

## 2024-02-27 DIAGNOSIS — F419 Anxiety disorder, unspecified: Secondary | ICD-10-CM | POA: Diagnosis not present

## 2024-02-27 DIAGNOSIS — E559 Vitamin D deficiency, unspecified: Secondary | ICD-10-CM | POA: Diagnosis not present

## 2024-02-27 DIAGNOSIS — M8589 Other specified disorders of bone density and structure, multiple sites: Secondary | ICD-10-CM | POA: Diagnosis not present

## 2024-02-27 DIAGNOSIS — R079 Chest pain, unspecified: Secondary | ICD-10-CM | POA: Diagnosis not present

## 2024-02-27 DIAGNOSIS — E785 Hyperlipidemia, unspecified: Secondary | ICD-10-CM | POA: Diagnosis not present

## 2024-02-27 DIAGNOSIS — I83893 Varicose veins of bilateral lower extremities with other complications: Secondary | ICD-10-CM | POA: Diagnosis not present

## 2024-02-27 DIAGNOSIS — I10 Essential (primary) hypertension: Secondary | ICD-10-CM | POA: Diagnosis not present

## 2024-03-07 DIAGNOSIS — Z0000 Encounter for general adult medical examination without abnormal findings: Secondary | ICD-10-CM | POA: Diagnosis not present

## 2024-03-07 DIAGNOSIS — I10 Essential (primary) hypertension: Secondary | ICD-10-CM | POA: Diagnosis not present

## 2024-03-15 DIAGNOSIS — M8589 Other specified disorders of bone density and structure, multiple sites: Secondary | ICD-10-CM | POA: Diagnosis not present

## 2024-03-16 DIAGNOSIS — M8589 Other specified disorders of bone density and structure, multiple sites: Secondary | ICD-10-CM | POA: Diagnosis not present

## 2024-03-16 DIAGNOSIS — Z780 Asymptomatic menopausal state: Secondary | ICD-10-CM | POA: Diagnosis not present

## 2024-05-17 DIAGNOSIS — I10 Essential (primary) hypertension: Secondary | ICD-10-CM | POA: Diagnosis not present

## 2024-08-30 DIAGNOSIS — I10 Essential (primary) hypertension: Secondary | ICD-10-CM | POA: Diagnosis not present

## 2024-08-30 DIAGNOSIS — Z23 Encounter for immunization: Secondary | ICD-10-CM | POA: Diagnosis not present

## 2024-08-30 DIAGNOSIS — E785 Hyperlipidemia, unspecified: Secondary | ICD-10-CM | POA: Diagnosis not present

## 2024-08-30 DIAGNOSIS — E559 Vitamin D deficiency, unspecified: Secondary | ICD-10-CM | POA: Diagnosis not present
